# Patient Record
Sex: Male | Born: 1985 | Race: Black or African American | Hispanic: No | Marital: Single | Smoking: Current every day smoker
Health system: Southern US, Community
[De-identification: ages and names within clinical notes are randomized; demographics above are authoritative.]

## PROBLEM LIST (undated history)

## (undated) DIAGNOSIS — N200 Calculus of kidney: Secondary | ICD-10-CM

---
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
## 2006-04-10 ENCOUNTER — Emergency Department (HOSPITAL_COMMUNITY): Admission: EM | Admit: 2006-04-10 | Discharge: 2006-04-10 | Payer: Self-pay | Admitting: Emergency Medicine

## 2007-05-05 ENCOUNTER — Emergency Department (HOSPITAL_COMMUNITY): Admission: EM | Admit: 2007-05-05 | Discharge: 2007-05-05 | Payer: Self-pay | Admitting: Emergency Medicine

## 2007-06-12 ENCOUNTER — Emergency Department (HOSPITAL_COMMUNITY): Admission: EM | Admit: 2007-06-12 | Discharge: 2007-06-12 | Payer: Self-pay | Admitting: Emergency Medicine

## 2011-05-24 LAB — URINALYSIS, ROUTINE W REFLEX MICROSCOPIC
Glucose, UA: NEGATIVE
Protein, ur: NEGATIVE

## 2011-05-24 LAB — DIFFERENTIAL
Eosinophils Absolute: 0.3
Eosinophils Relative: 6 — ABNORMAL HIGH
Lymphs Abs: 2
Monocytes Relative: 7
Neutrophils Relative %: 45

## 2011-05-24 LAB — POCT CARDIAC MARKERS: Myoglobin, poc: 41

## 2011-05-24 LAB — RAPID URINE DRUG SCREEN, HOSP PERFORMED
Barbiturates: NOT DETECTED
Benzodiazepines: NOT DETECTED

## 2011-05-24 LAB — CBC
HCT: 40.6
Hemoglobin: 13.9
MCV: 87.7
RBC: 4.63
WBC: 4.7

## 2011-05-24 LAB — BASIC METABOLIC PANEL
Chloride: 102
GFR calc Af Amer: >60
Potassium: 3.7

## 2016-07-16 ENCOUNTER — Encounter (HOSPITAL_COMMUNITY): Payer: Self-pay | Admitting: Emergency Medicine

## 2016-07-16 ENCOUNTER — Emergency Department (HOSPITAL_COMMUNITY)
Admission: EM | Admit: 2016-07-16 | Discharge: 2016-07-16 | Disposition: A | Discharge status: Home / Self Care | Payer: Self-pay | Attending: Emergency Medicine | Admitting: Emergency Medicine

## 2016-07-16 DIAGNOSIS — H04301 Unspecified dacryocystitis of right lacrimal passage: Secondary | ICD-10-CM | POA: Insufficient documentation

## 2016-07-16 DIAGNOSIS — F17210 Nicotine dependence, cigarettes, uncomplicated: Secondary | ICD-10-CM | POA: Insufficient documentation

## 2016-07-16 MED ORDER — CEPHALEXIN 500 MG PO CAPS
500.0000 mg | ORAL_CAPSULE | Freq: Once | ORAL | Status: AC
  Administered 2016-07-16: 500 mg via ORAL
  Filled 2016-07-16: qty 1

## 2016-07-16 MED ORDER — CEPHALEXIN 500 MG PO CAPS: 500.0000 mg | ORAL_CAPSULE | Freq: Four times a day (QID) | ORAL | 0 refills | Status: DC

## 2016-07-16 NOTE — Discharge Instructions (Signed)
Use OTC lubricating eye drops  every 2-3 hrs.  Warm wet compresses to your eye.  Follow-up with the eye doctor listed if not improving in a few days

## 2016-07-16 NOTE — ED Provider Notes (Signed)
[facility]EMERGENCY DEPT Provider Note  CSN: [id number] Arrival date & time: 07/16/16  1601 By signing my name below, I, Levon HedgerElizabeth Hall, attest that this documentation has been prepared under the direction and in the presence of non-physician practitioner, Pauline Ausammy Raymonda Pell, PA-C. Electronically Signed: Levon HedgerElizabeth Hall, Scribe. 07/16/2016. 5:20 PM.   History   Chief Complaint Chief Complaint  Patient presents with  . Abscess   HPI Willie Ross is a 30 y.o. male who presents to the Emergency Department complaining of swelling and redness under his right eye that began yesterday. He notes associated itching and mild tearing.  Per pt, he first noticed  "a small bump in the corner of my eye" and has continued to swell. He has applied a warm compress with no relief. No other treatments tried PTA. He denies any discharge, facial pain, headache, visual disturbance, fever, nausea, vomiting    The history is provided by the patient. No language interpreter was used.   History reviewed. No pertinent past medical history.  There are no active problems to display for this patient.  History reviewed. No pertinent surgical history.  Home Medications    Prior to Admission medications   Not on File    Family History No family history on file.  Social History Social History  Substance Use Topics  . Smoking status: Current Every Day Smoker    Packs/day: 0.25    Types: Cigarettes  . Smokeless tobacco: Never Used  . Alcohol use Yes     Comment: occ   Allergies   Patient has no known allergies.  Review of Systems Review of Systems  Constitutional: Negative for appetite change, chills and fever.  Eyes: Negative for photophobia, pain, discharge and visual disturbance.       Redness and swelling below the right eye  Respiratory: Negative for cough and shortness of breath.   Gastrointestinal: Negative for nausea and vomiting.  Musculoskeletal: Negative for neck pain and neck stiffness.    Neurological: Negative for dizziness, weakness, numbness and headaches.       Physical Exam Updated Vital Signs BP 118/70   Pulse 80   Temp 98.5 F (36.9 C)   Resp 18   Ht 5\' 10"  (1.778 m)   Wt 135 lb (61.2 kg)   SpO2 100%   BMI 19.37 kg/m   Physical Exam  Constitutional: He is oriented to person, place, and time. He appears well-developed and well-nourished. No distress.  HENT:  Head: Normocephalic and atraumatic.  Mouth/Throat: Oropharynx is clear and moist.  Eyes: Conjunctivae and EOM are normal. Pupils are equal, round, and reactive to light. Lids are everted and swept, no foreign bodies found. Right eye exhibits no chemosis and no discharge. No foreign body present in the right eye. Left eye exhibits no discharge. No scleral icterus.    Mild focal edema and erythema of the right lower lid and along the right medial canthus.    Neck: Normal range of motion.  Cardiovascular: Normal rate, regular rhythm and intact distal pulses.   Pulmonary/Chest: Effort normal.  Lymphadenopathy:    He has no cervical adenopathy.  Neurological: He is alert and oriented to person, place, and time.  Skin: Skin is warm and dry.  Psychiatric: He has a normal mood and affect.  Nursing note and vitals reviewed.  ED Treatments / Results  DIAGNOSTIC STUDIES:  Oxygen Saturation is 100% on RA, normal by my interpretation.    COORDINATION OF CARE:  5:16 PM Discussed treatment plan which includes abx  with pt at bedside and pt agreed to plan.   Labs (all labs ordered are listed, but only abnormal results are displayed) Labs Reviewed - No data to display  EKG  EKG Interpretation None      Radiology No results found.  Procedures Procedures (including critical care time)      Visual Acuity  Right Eye Distance: 20/20 Left Eye Distance: 20/20 Bilateral Distance: 20/20   Medications Ordered in ED Medications - No data to display   Initial Impression / Assessment and Plan /  ED Course  I have reviewed the triage vital signs and the nursing notes.  Pertinent labs & imaging results that were available during my care of the patient were reviewed by me and considered in my medical decision making (see chart for details).  Clinical Course    Pt well appearing.  No involvement of the eye.  Possible early hordeolum vs lacrimal duct infection.  No visual changes. Pt agrees to OTC lubricating eye drop, warm compresses and Rx keflex.  Referral to ophtho.  Return precautions given.    Final Clinical Impressions(s) / ED Diagnoses   Final diagnoses:  Infection of right lacrimal duct    New Prescriptions New Prescriptions   No medications on file    I personally performed the services described in this documentation, which was scribed in my presence. The recorded information has been reviewed and is accurate.    Pauline Ausammy Daegen Berrocal, PA-C 07/17/16 0101    Marily MemosJason Mesner, MD 07/19/16 [phone]

## 2016-07-16 NOTE — ED Triage Notes (Signed)
Patient complains of abscess under right eye that started yesterday. Denies vision changes, fever, n/v.

## 2016-10-23 ENCOUNTER — Emergency Department (HOSPITAL_COMMUNITY)
Admission: EM | Admit: 2016-10-23 | Discharge: 2016-10-23 | Disposition: A | Discharge status: Home / Self Care | Payer: BLUE CROSS/BLUE SHIELD | Attending: Emergency Medicine | Admitting: Emergency Medicine

## 2016-10-23 ENCOUNTER — Encounter (HOSPITAL_COMMUNITY): Payer: Self-pay | Admitting: Cardiology

## 2016-10-23 DIAGNOSIS — F17210 Nicotine dependence, cigarettes, uncomplicated: Secondary | ICD-10-CM | POA: Diagnosis not present

## 2016-10-23 DIAGNOSIS — Z792 Long term (current) use of antibiotics: Secondary | ICD-10-CM | POA: Diagnosis not present

## 2016-10-23 DIAGNOSIS — L0201 Cutaneous abscess of face: Secondary | ICD-10-CM | POA: Diagnosis not present

## 2016-10-23 MED ORDER — LIDOCAINE HCL (PF) 1 % IJ SOLN
5.0000 mL | Freq: Once | INTRAMUSCULAR | Status: AC
  Administered 2016-10-23: 5 mL
  Filled 2016-10-23: qty 5

## 2016-10-23 MED ORDER — POVIDONE-IODINE 10 % EX SOLN
CUTANEOUS | Status: AC
  Filled 2016-10-23: qty 118

## 2016-10-23 MED ORDER — SULFAMETHOXAZOLE-TRIMETHOPRIM 800-160 MG PO TABS: 1.0000 | ORAL_TABLET | Freq: Two times a day (BID) | ORAL | 0 refills | Status: AC

## 2016-10-23 NOTE — ED Provider Notes (Addendum)
[facility] Provider Note   CSN: [id number] Arrival date & time: 10/23/16  1450     History   Chief Complaint Chief Complaint  Patient presents with  . Recurrent Skin Infections    HPI Willie Ross is a 31 y.o. male.  HPI   31 year old male with a facial abscess. Right cheek. Onset shortly after shaving with a razor. Patient states that he usually uses clippers. The lesion has progressively become larger and more painful. No drainage. No fevers or chills. Has not tried taking anything for her symptoms.  History reviewed. No pertinent past medical history.  There are no active problems to display for this patient.   History reviewed. No pertinent surgical history.     Home Medications    Prior to Admission medications   Medication Sig Start Date End Date Taking? Authorizing Provider  cephALEXin (KEFLEX) 500 MG capsule Take 1 capsule (500 mg total) by mouth 4 (four) times daily. For 7 days 07/16/16   Pauline Ausammy Triplett, PA-C    Family History History reviewed. No pertinent family history.  Social History Social History  Substance Use Topics  . Smoking status: Current Every Day Smoker    Packs/day: 0.25    Types: Cigarettes  . Smokeless tobacco: Never Used  . Alcohol use Yes     Comment: occ     Allergies   Patient has no known allergies.   Review of Systems Review of Systems   Physical Exam Updated Vital Signs BP 118/71   Pulse 74   Temp 98.9 F (37.2 C) (Oral)   Resp 16   Ht 6' (1.829 m)   Wt 135 lb (61.2 kg)   SpO2 100%   BMI 18.31 kg/m   Physical Exam  Constitutional: He appears well-developed and well-nourished. No distress.  HENT:  Head: Normocephalic.    Tender, fluctuant lesion in the pictured area. Approximately [id number] cm in size. No surrounding cellulitis. No drainage.  Eyes: Conjunctivae are normal. Right eye exhibits no discharge. Left eye exhibits no discharge.  Neck: Neck supple.  Cardiovascular: Normal  rate, regular rhythm and normal heart sounds.  Exam reveals no gallop and no friction rub.   No murmur heard. Pulmonary/Chest: Effort normal and breath sounds normal. No respiratory distress.  Abdominal: Soft. He exhibits no distension. There is no tenderness.  Musculoskeletal: He exhibits no edema or tenderness.  Neurological: He is alert.  Skin: Skin is warm and dry.  Psychiatric: He has a normal mood and affect. His behavior is normal. Thought content normal.  Nursing note and vitals reviewed.    ED Treatments / Results  Labs (all labs ordered are listed, but only abnormal results are displayed) Labs Reviewed - No data to display  EKG  EKG Interpretation None       Radiology No results found.  Procedures Procedures (including critical care time)  INCISION AND DRAINAGE Performed by: Raeford RazorKOHUT, Oddis Westling Consent: Verbal consent obtained. Risks and benefits: risks, benefits and alternatives were discussed Type: abscess  Body area: R face  Anesthesia: local infiltration  Incision was made with a scalpel.  Local anesthetic: lidocaine 1% w/o  Anesthetic total: 2 ml  Complexity: complex Blunt dissection to break up loculations  Drainage: purulent  Drainage amount: large  Packing material: none  Patient tolerance: Patient tolerated the procedure well with no immediate complications.     Medications Ordered in ED Medications  lidocaine (PF) (XYLOCAINE) 1 % injection 5 mL (not administered)     Initial Impression /  Assessment and Plan / ED Course  I have reviewed the triage vital signs and the nursing notes.  Pertinent labs & imaging results that were available during my care of the patient were reviewed by me and considered in my medical decision making (see chart for details).     31 year old male withfacial abscess the right cheek without surrounding cellulitis. Incised and drained. Continued wound care return precautions discussed.  Final Clinical  Impressions(s) / ED Diagnoses   Final diagnoses:  Facial abscess    New Prescriptions New Prescriptions   No medications on file     Raeford Razor, MD 10/23/16 1610

## 2016-10-23 NOTE — ED Triage Notes (Signed)
Knot to right side of face for 3 days.

## 2016-10-23 NOTE — ED Notes (Signed)
edp in with pt 

## 2019-06-11 ENCOUNTER — Other Ambulatory Visit: Payer: Self-pay

## 2019-06-11 DIAGNOSIS — Z20822 Contact with and (suspected) exposure to covid-19: Secondary | ICD-10-CM

## 2019-06-12 LAB — NOVEL CORONAVIRUS, NAA: SARS-CoV-2, NAA: DETECTED — AB

## 2019-06-20 ENCOUNTER — Other Ambulatory Visit: Payer: Self-pay

## 2019-06-20 DIAGNOSIS — Z20822 Contact with and (suspected) exposure to covid-19: Secondary | ICD-10-CM

## 2019-06-21 ENCOUNTER — Telehealth: Payer: Self-pay

## 2019-06-21 NOTE — Telephone Encounter (Signed)
Patient called in requesting [facility] lab results - DOB/Verified - results pending. Reviewed testing process with patient. Assisted with MyChart setup, no further questions.

## 2019-06-22 ENCOUNTER — Telehealth: Payer: Self-pay

## 2019-06-22 LAB — NOVEL CORONAVIRUS, NAA

## 2019-06-22 NOTE — Telephone Encounter (Signed)
Received call from patient checking Covid results.  Advised results are positive.  Advised to contact PCP or visit ED if symptoms worsen.   

## 2019-06-22 NOTE — Telephone Encounter (Signed)
Pt called for lab results advised that final report has not come back for covid.

## 2020-08-02 ENCOUNTER — Other Ambulatory Visit: Payer: Self-pay

## 2020-08-02 ENCOUNTER — Emergency Department (HOSPITAL_COMMUNITY)
Admission: EM | Admit: 2020-08-02 | Discharge: 2020-08-02 | Disposition: A | Discharge status: Home / Self Care | Payer: BC Managed Care – PPO | Attending: Emergency Medicine | Admitting: Emergency Medicine

## 2020-08-02 ENCOUNTER — Emergency Department (HOSPITAL_COMMUNITY): Payer: BC Managed Care – PPO

## 2020-08-02 ENCOUNTER — Encounter (HOSPITAL_COMMUNITY): Payer: Self-pay

## 2020-08-02 DIAGNOSIS — Z87891 Personal history of nicotine dependence: Secondary | ICD-10-CM | POA: Diagnosis not present

## 2020-08-02 DIAGNOSIS — N200 Calculus of kidney: Secondary | ICD-10-CM | POA: Diagnosis not present

## 2020-08-02 DIAGNOSIS — R109 Unspecified abdominal pain: Secondary | ICD-10-CM | POA: Diagnosis not present

## 2020-08-02 HISTORY — DX: Calculus of kidney: N20.0

## 2020-08-02 LAB — URINALYSIS, ROUTINE W REFLEX MICROSCOPIC
Bilirubin Urine: NEGATIVE
Glucose, UA: NEGATIVE mg/dL
Hgb urine dipstick: NEGATIVE
Ketones, ur: NEGATIVE mg/dL
Leukocytes,Ua: NEGATIVE
Nitrite: NEGATIVE
Protein, ur: NEGATIVE mg/dL
Specific Gravity, Urine: 1.016 (ref 1.005–1.030)
pH: 5 (ref 5.0–8.0)

## 2020-08-02 MED ORDER — KETOROLAC TROMETHAMINE 30 MG/ML IJ SOLN
30.0000 mg | Freq: Once | INTRAMUSCULAR | Status: AC
  Administered 2020-08-02: 30 mg via INTRAMUSCULAR
  Filled 2020-08-02: qty 1

## 2020-08-02 MED ORDER — IBUPROFEN 800 MG PO TABS: 800.0000 mg | ORAL_TABLET | Freq: Three times a day (TID) | ORAL | 0 refills | Status: DC | PRN

## 2020-08-02 MED ORDER — DICLOFENAC SODIUM 1 % EX GEL: 2.0000 g | Freq: Four times a day (QID) | CUTANEOUS | 0 refills | Status: DC | PRN

## 2020-08-02 NOTE — ED Notes (Signed)
Pt ambulatory to waiting room. Pt verbalized understanding of discharge instructions.   

## 2020-08-02 NOTE — ED Provider Notes (Signed)
Emergency Department Provider Note   I have reviewed the triage vital signs and the nursing notes.   HISTORY  Chief Complaint Flank Pain   HPI Willie Ross is a 34 y.o. male with past medical history of kidney stone presents to the emergency department with left flank pain which began after arriving at work today.  Patient states that he has a known kidney stone on the left but not had significant symptoms from this.  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  He denies any lower back pain or radiation of pain into the legs.  Denies numbness.  No bowel/bladder incontinence.  No dysuria, hesitancy, urgency.  No fevers.  Past Medical History:  Diagnosis Date   Kidney stones     There are no problems to display for this patient.   History reviewed. No pertinent surgical history.  Allergies Patient has no known allergies.  No family history on file.  Social History Social History   Tobacco Use   Smoking status: Former Smoker    Packs/day: 0.25    Types: Cigarettes   Smokeless tobacco: Never Used  Substance Use Topics   Alcohol use: Not Currently    Comment: occ   Drug use: No    Review of Systems  Constitutional: No fever/chills Eyes: No visual changes. ENT: No sore throat. Cardiovascular: Denies chest pain. Respiratory: Denies shortness of breath. Gastrointestinal: Positive flank/abdominal pain (left).  No nausea, no vomiting.  No diarrhea.  No constipation. Genitourinary: Negative for dysuria. Musculoskeletal: Positive for left back pain. Skin: Negative for rash. Neurological: Negative for headaches, focal weakness or numbness.  10-point ROS otherwise negative.  ____________________________________________   PHYSICAL EXAM:  VITAL SIGNS: ED Triage Vitals  Enc Vitals Group     BP 08/02/20 0805 (!) 127/96     Pulse Rate 08/02/20 0805 96      Resp 08/02/20 0805 17     Temp 08/02/20 0805 99.6 F (37.6 C)     Temp Source 08/02/20 0805 Oral     SpO2 08/02/20 0805 100 %     Weight 08/02/20 0804 135 lb (61.2 kg)     Height 08/02/20 0804 6' (1.829 m)   Constitutional: Alert and oriented. Well appearing and in no acute distress. Eyes: Conjunctivae are normal Head: Atraumatic. Nose: No congestion/rhinnorhea. Mouth/Throat: Mucous membranes are moist.  Neck: No stridor.  Cardiovascular: Normal rate, regular rhythm. Good peripheral circulation. Grossly normal heart sounds.   Respiratory: Normal respiratory effort.  No retractions. Lungs CTAB. Gastrointestinal: Soft and nontender. No distention.  Musculoskeletal: No lower extremity tenderness nor edema. No gross deformities of extremities. Neurologic:  Normal speech and language. No gross focal neurologic deficits are appreciated.  Skin:  Skin is warm, dry and intact. No rash noted.  ____________________________________________   LABS (all labs ordered are listed, but only abnormal results are displayed)  Labs Reviewed  URINALYSIS, ROUTINE W REFLEX MICROSCOPIC   ____________________________________________  RADIOLOGY  CT Renal Stone Study  Result Date: 08/02/2020 CLINICAL DATA:  Left flank pain EXAM: CT ABDOMEN AND PELVIS WITHOUT CONTRAST TECHNIQUE: Multidetector CT imaging of the abdomen and pelvis was performed following the standard protocol without oral or IV contrast. COMPARISON:  None. FINDINGS: Lower chest: Lung bases are clear. Hepatobiliary: No focal liver lesions are evident on this noncontrast enhanced study. Gallbladder wall is not appreciably thickened. There is no biliary  duct dilatation. Pancreas: No pancreatic mass or inflammatory focus. Spleen: No splenic lesions are evident. Adrenals/Urinary Tract: Adrenals bilaterally appear normal. There is no appreciable renal mass or hydronephrosis on either side. There is no appreciable renal or ureteral calculus on  either side. Urinary bladder is midline with wall thickness within normal limits. Stomach/Bowel: There is no appreciable bowel wall or mesenteric thickening. There is no evident bowel obstruction. The terminal ileum appears normal. There is no evident free air or portal venous air. Vascular/Lymphatic: There is no evident abdominal aortic aneurysm. No arterial vascular lesions are evident. There is a circumaortic left renal vein, an anatomic variant. No adenopathy is evident in the abdomen or pelvis. Reproductive: Prostate and seminal vesicles are normal in size and configuration. Other: Appendix appears normal. No abscess or ascites is evident in the abdomen or pelvis. Musculoskeletal: No blastic or lytic bone lesions. No intramuscular lesions. IMPRESSION: 1. A cause for patient's symptoms has not established with this study. 2. No evident renal ureteral calculus on either side. There is no appreciable hydronephrosis. Urinary bladder wall thickness is normal. 3. No bowel wall thickening or bowel obstruction. No abscess in the abdomen or pelvis. Appendix region appears normal. Electronically Signed   By: Bretta Bang III M.D.   On: 08/02/2020 09:46    ____________________________________________   PROCEDURES  Procedure(s) performed:   Procedures  None  ____________________________________________   INITIAL IMPRESSION / ASSESSMENT AND PLAN / ED COURSE  Pertinent labs & imaging results that were available during my care of the patient were reviewed by me and considered in my medical decision making (see chart for details).   Patient presents to the emergency department for evaluation of left flank pain worse with moving heavy bags at work.  No neurologic deficits in the lower extremities including strength, sensation, reflexes.  No anterior abdominal tenderness.  Patient has known kidney stone by his report and so plan for CT renal to evaluate for this further.  We will also obtain UA but doubt  infection or pyelonephritis.  No findings on exam to suspect acute spine emergency such as cauda equina, hemorrhage, or spine infection.  No indication for MRI.   11:15 AM  CT imaging reviewed showing no stone or other acute process.  UA without infection or hemoglobin.  Suspect MSK etiology clinically.  Will discharge home with Motrin and Voltaren along with work note.  Discussed plan for close PCP follow-up along with ED return precautions. ____________________________________________  FINAL CLINICAL IMPRESSION(S) / ED DIAGNOSES  Final diagnoses:  Left flank pain     MEDICATIONS GIVEN DURING THIS VISIT:  Medications  ketorolac (TORADOL) 30 MG/ML injection 30 mg (30 mg Intramuscular Given 08/02/20 0849)     NEW OUTPATIENT MEDICATIONS STARTED DURING THIS VISIT:  New Prescriptions   DICLOFENAC SODIUM (VOLTAREN) 1 % GEL    Apply 2 g topically 4 (four) times daily as needed.   IBUPROFEN (ADVIL) 800 MG TABLET    Take 1 tablet (800 mg total) by mouth every 8 (eight) hours as needed for moderate pain.    Note:  This document was prepared using Dragon voice recognition software and may include unintentional dictation errors.  Alona Bene, MD, [facility] Emergency Medicine    Olson Lucarelli, Arlyss Repress, MD 08/02/20 1115

## 2020-08-02 NOTE — ED Triage Notes (Signed)
Pt reports that he has 8 mm stone  In left kidney. Pt reports that pain started this morning after lifting heavy bag

## 2020-08-02 NOTE — Discharge Instructions (Signed)

## 2020-08-09 ENCOUNTER — Emergency Department (HOSPITAL_COMMUNITY)
Admission: EM | Admit: 2020-08-09 | Discharge: 2020-08-10 | Disposition: A | Discharge status: Home / Self Care | Payer: BC Managed Care – PPO | Attending: Emergency Medicine | Admitting: Emergency Medicine

## 2020-08-09 ENCOUNTER — Encounter (HOSPITAL_COMMUNITY): Payer: Self-pay | Admitting: Emergency Medicine

## 2020-08-09 ENCOUNTER — Other Ambulatory Visit: Payer: Self-pay

## 2020-08-09 DIAGNOSIS — R112 Nausea with vomiting, unspecified: Secondary | ICD-10-CM | POA: Diagnosis not present

## 2020-08-09 DIAGNOSIS — Z87891 Personal history of nicotine dependence: Secondary | ICD-10-CM | POA: Insufficient documentation

## 2020-08-09 LAB — COMPREHENSIVE METABOLIC PANEL
ALT: 64 U/L — ABNORMAL HIGH (ref 0–44)
AST: 47 U/L — ABNORMAL HIGH (ref 15–41)
Albumin: 5.1 g/dL — ABNORMAL HIGH (ref 3.5–5.0)
Alkaline Phosphatase: 44 U/L (ref 38–126)
Anion gap: 11 (ref 5–15)
BUN: 12 mg/dL (ref 6–20)
CO2: 28 mmol/L (ref 22–32)
Calcium: 9.9 mg/dL (ref 8.9–10.3)
Chloride: 93 mmol/L — ABNORMAL LOW (ref 98–111)
Creatinine, Ser: 0.88 mg/dL (ref 0.61–1.24)
GFR, Estimated: >60 mL/min (ref >60)
Glucose, Bld: 93 mg/dL (ref 70–99)
Potassium: 4.2 mmol/L (ref 3.5–5.1)
Sodium: 132 mmol/L — ABNORMAL LOW (ref 135–145)
Total Bilirubin: 1.4 mg/dL — ABNORMAL HIGH (ref 0.3–1.2)
Total Protein: 8.4 g/dL — ABNORMAL HIGH (ref 6.5–8.1)

## 2020-08-09 LAB — CBC
HCT: 44.9 % (ref 39.0–52.0)
Hemoglobin: 15.3 g/dL (ref 13.0–17.0)
MCH: 30.8 pg (ref 26.0–34.0)
MCHC: 34.1 g/dL (ref 30.0–36.0)
MCV: 90.3 fL (ref 80.0–100.0)
Platelets: 276 10*3/uL (ref 150–400)
RBC: 4.97 MIL/uL (ref 4.22–5.81)
RDW: 12.1 % (ref 11.5–15.5)
WBC: 6.5 10*3/uL (ref 4.0–10.5)
nRBC: 0 % (ref 0.0–0.2)

## 2020-08-09 LAB — LIPASE, BLOOD: Lipase: 27 U/L (ref 11–51)

## 2020-08-09 MED ORDER — PROMETHAZINE HCL 25 MG/ML IJ SOLN
25.0000 mg | Freq: Once | INTRAMUSCULAR | Status: AC
  Administered 2020-08-10: 25 mg via INTRAMUSCULAR
  Filled 2020-08-09: qty 1

## 2020-08-09 NOTE — ED Provider Notes (Signed)
[facility] EMERGENCY DEPARTMENT Provider Note   CSN: [id number] Arrival date & time: 08/09/20  1846     History Chief Complaint  Patient presents with  . Emesis    Willie Ross is a 34 y.o. male.  Woke up this morning. Felt unwell. Had multiple episodes of nbnb emesis. 4-5 episodes of loose stools. No fevers. No urinary symptoms. No sick contacts. No suspicios food intake. No abdominal pain aside that associated with emesis. Hasn't tried anything to help with symptoms.         Past Medical History:  Diagnosis Date  . Kidney stones     There are no problems to display for this patient.   History reviewed. No pertinent surgical history.     History reviewed. No pertinent family history.  Social History   Tobacco Use  . Smoking status: Former Smoker    Packs/day: 0.25    Types: Cigarettes    Quit date: 08/10/2019    Years since quitting: 1.0  . Smokeless tobacco: Never Used  Vaping Use  . Vaping Use: Every day  . Substances: Nicotine, Flavoring  Substance Use Topics  . Alcohol use: Not Currently    Alcohol/week: 18.0 standard drinks    Types: 18 Cans of beer per week    Comment: occ  . Drug use: No    Home Medications Prior to Admission medications   Medication Sig Start Date End Date Taking? Authorizing Provider  diclofenac Sodium (VOLTAREN) 1 % GEL Apply 2 g topically 4 (four) times daily as needed. 08/02/20  Yes Long, Arlyss Repress, MD  ibuprofen (ADVIL) 800 MG tablet Take 1 tablet (800 mg total) by mouth every 8 (eight) hours as needed for moderate pain. 08/02/20  Yes Long, Arlyss Repress, MD  ondansetron (ZOFRAN ODT) 4 MG disintegrating tablet 4mg  ODT q4 hours prn nausea/vomit 08/10/20  Yes Shaana Acocella, 08/12/20, MD  promethazine (PHENERGAN) 25 MG suppository Place 1 suppository (25 mg total) rectally every 6 (six) hours as needed for nausea or vomiting. 08/10/20  Yes Eilidh Marcano, 08/12/20, MD  promethazine (PHENERGAN) 25 MG tablet Take 1 tablet (25 mg total) by  mouth every 6 (six) hours as needed for nausea or vomiting. 08/10/20  Yes Lashawna Poche, 08/12/20, MD    Allergies    Patient has no known allergies.  Review of Systems   Review of Systems  All other systems reviewed and are negative.   Physical Exam Updated Vital Signs BP 122/62   Pulse 75   Temp 99 F (37.2 C) (Oral)   Resp 14   Ht 6' (1.829 m)   Wt 61.2 kg   SpO2 98%   BMI 18.31 kg/m   Physical Exam Vitals and nursing note reviewed.  Constitutional:      Appearance: He is well-developed and well-nourished.     Comments: Appears to feel unwell but no distress  HENT:     Head: Normocephalic and atraumatic.     Mouth/Throat:     Mouth: Mucous membranes are moist.     Pharynx: Oropharynx is clear.  Eyes:     Pupils: Pupils are equal, round, and reactive to light.  Cardiovascular:     Rate and Rhythm: Normal rate.  Pulmonary:     Effort: Pulmonary effort is normal. No respiratory distress.  Abdominal:     General: There is no distension.     Tenderness: There is no abdominal tenderness. There is no guarding or rebound.     Hernia: No hernia is present.  Musculoskeletal:        General: Normal range of motion.     Cervical back: Normal range of motion.  Skin:    General: Skin is warm and dry.  Neurological:     General: No focal deficit present.     Mental Status: He is alert.     ED Results / Procedures / Treatments   Labs (all labs ordered are listed, but only abnormal results are displayed) Labs Reviewed  COMPREHENSIVE METABOLIC PANEL - Abnormal; Notable for the following components:      Result Value   Sodium 132 (*)    Chloride 93 (*)    Total Protein 8.4 (*)    Albumin 5.1 (*)    AST 47 (*)    ALT 64 (*)    Total Bilirubin 1.4 (*)    All other components within normal limits  LIPASE, BLOOD  CBC  URINALYSIS, ROUTINE W REFLEX MICROSCOPIC    EKG None  Radiology No results found.  Procedures Procedures (including critical care time)  Medications  Ordered in ED Medications  promethazine (PHENERGAN) injection 25 mg (25 mg Intramuscular Given 08/10/20 0034)    ED Course  I have reviewed the triage vital signs and the nursing notes.  Pertinent labs & imaging results that were available during my care of the patient were reviewed by me and considered in my medical decision making (see chart for details).    MDM Rules/Calculators/A&P                          Gastroenteritis, likely viral.  Patient tolerating p.o. here.  Abdomen benign low suspicion for appendicitis bowel obstruction or other intra-abdominal pathology requiring CT scan.  No indication for admission to hospital.  Patient appears to be stable for discharge at this time.   Final Clinical Impression(s) / ED Diagnoses Final diagnoses:  Non-intractable vomiting with nausea, unspecified vomiting type    Rx / DC Orders ED Discharge Orders         Ordered    ondansetron (ZOFRAN ODT) 4 MG disintegrating tablet        08/10/20 0234    promethazine (PHENERGAN) 25 MG tablet  Every 6 hours PRN        08/10/20 0235    promethazine (PHENERGAN) 25 MG suppository  Every 6 hours PRN        08/10/20 0235           Letanya Froh, Barbara Cower, MD 08/10/20 0451

## 2020-08-09 NOTE — ED Triage Notes (Signed)
Pt c/o vomiting that began today. Pt states he was tested for covid in [place] today, but has not gotten his results yet.

## 2020-08-10 MED ORDER — ONDANSETRON 4 MG PO TBDP: ORAL_TABLET | ORAL | 0 refills | Status: DC

## 2020-08-10 MED ORDER — PROMETHAZINE HCL 25 MG PO TABS: 25.0000 mg | ORAL_TABLET | Freq: Four times a day (QID) | ORAL | 0 refills | Status: DC | PRN

## 2020-08-10 MED ORDER — PROMETHAZINE HCL 25 MG RE SUPP: 25.0000 mg | Freq: Four times a day (QID) | RECTAL | 0 refills | Status: DC | PRN

## 2020-12-08 ENCOUNTER — Encounter (HOSPITAL_COMMUNITY): Payer: Self-pay

## 2020-12-08 ENCOUNTER — Emergency Department (HOSPITAL_COMMUNITY): Payer: BC Managed Care – PPO

## 2020-12-08 ENCOUNTER — Other Ambulatory Visit: Payer: Self-pay

## 2020-12-08 ENCOUNTER — Emergency Department (HOSPITAL_COMMUNITY)
Admission: EM | Admit: 2020-12-08 | Discharge: 2020-12-08 | Disposition: A | Discharge status: Home / Self Care | Payer: BC Managed Care – PPO | Attending: Emergency Medicine | Admitting: Emergency Medicine

## 2020-12-08 DIAGNOSIS — Z87891 Personal history of nicotine dependence: Secondary | ICD-10-CM | POA: Diagnosis not present

## 2020-12-08 DIAGNOSIS — R079 Chest pain, unspecified: Secondary | ICD-10-CM | POA: Insufficient documentation

## 2020-12-08 LAB — COMPREHENSIVE METABOLIC PANEL
ALT: 36 U/L (ref 0–44)
AST: 32 U/L (ref 15–41)
Albumin: 4.6 g/dL (ref 3.5–5.0)
Alkaline Phosphatase: 54 U/L (ref 38–126)
Anion gap: 12 (ref 5–15)
BUN: 11 mg/dL (ref 6–20)
CO2: 27 mmol/L (ref 22–32)
Calcium: 9.4 mg/dL (ref 8.9–10.3)
Chloride: 96 mmol/L — ABNORMAL LOW (ref 98–111)
Creatinine, Ser: 0.8 mg/dL (ref 0.61–1.24)
GFR, Estimated: >60 mL/min (ref >60)
Glucose, Bld: 93 mg/dL (ref 70–99)
Potassium: 4.7 mmol/L (ref 3.5–5.1)
Sodium: 135 mmol/L (ref 135–145)
Total Bilirubin: 0.9 mg/dL (ref 0.3–1.2)
Total Protein: 8.2 g/dL — ABNORMAL HIGH (ref 6.5–8.1)

## 2020-12-08 LAB — TROPONIN I (HIGH SENSITIVITY)
Troponin I (High Sensitivity): <2 ng/L (ref <18)
Troponin I (High Sensitivity): <2 ng/L (ref <18)

## 2020-12-08 LAB — CBC
HCT: 43.8 % (ref 39.0–52.0)
Hemoglobin: 14.4 g/dL (ref 13.0–17.0)
MCH: 31.3 pg (ref 26.0–34.0)
MCHC: 32.9 g/dL (ref 30.0–36.0)
MCV: 95.2 fL (ref 80.0–100.0)
Platelets: 244 10*3/uL (ref 150–400)
RBC: 4.6 MIL/uL (ref 4.22–5.81)
RDW: 13.3 % (ref 11.5–15.5)
WBC: 6.4 10*3/uL (ref 4.0–10.5)
nRBC: 0 % (ref 0.0–0.2)

## 2020-12-08 LAB — LIPASE, BLOOD: Lipase: 32 U/L (ref 11–51)

## 2020-12-08 MED ORDER — ALUM & MAG HYDROXIDE-SIMETH 200-200-20 MG/5ML PO SUSP
30.0000 mL | Freq: Once | ORAL | Status: AC
  Administered 2020-12-08: 30 mL via ORAL
  Filled 2020-12-08: qty 30

## 2020-12-08 MED ORDER — LIDOCAINE VISCOUS HCL 2 % MT SOLN
15.0000 mL | Freq: Once | OROMUCOSAL | Status: AC
  Administered 2020-12-08: 15 mL via ORAL
  Filled 2020-12-08: qty 15

## 2020-12-08 NOTE — ED Provider Notes (Signed)
[facility] EMERGENCY DEPARTMENT Provider Note   CSN: [id number] Arrival date & time: 12/08/20  7829     History Chief Complaint  Patient presents with  . Chest Pain    Willie Ross is a 35 y.o. male.  HPI 35 year old male who complains of intermittent chest pain that has been ongoing for the last 2 weeks.  Patient reports chest pain will be burning and aching in his controlled chest.  Currently experiencing it now.  He cannot identify a pattern, does not think it is worse with eating or ambulation.  No aggravating or alleviating factors.  He does endorse drinking several beers every evening after work.  He reports that he fell earlier this month and is doing some physical therapy for his back.  He denies any dizziness, syncope, shortness of breath.  No radiation.  Denies any history of cardiac issues.  Denies any illicit drug use.  He has never had anything like this happen in the past.    Past Medical History:  Diagnosis Date  . Kidney stones     There are no problems to display for this patient.   History reviewed. No pertinent surgical history.     History reviewed. No pertinent family history.  Social History   Tobacco Use  . Smoking status: Former Smoker    Packs/day: 0.25    Types: Cigarettes    Quit date: 08/10/2019    Years since quitting: 1.3  . Smokeless tobacco: Never Used  Vaping Use  . Vaping Use: Every day  . Substances: Nicotine, Flavoring  Substance Use Topics  . Alcohol use: Not Currently    Alcohol/week: 18.0 standard drinks    Types: 18 Cans of beer per week    Comment: occ  . Drug use: No    Home Medications Prior to Admission medications   Medication Sig Start Date End Date Taking? Authorizing Provider  diclofenac Sodium (VOLTAREN) 1 % GEL Apply 2 g topically 4 (four) times daily as needed. Patient not taking: Reported on 12/08/2020 08/02/20   Maia Plan, MD  ibuprofen (ADVIL) 800 MG tablet Take 1 tablet (800 mg total) by  mouth every 8 (eight) hours as needed for moderate pain. Patient not taking: Reported on 12/08/2020 08/02/20   Maia Plan, MD  ondansetron (ZOFRAN ODT) 4 MG disintegrating tablet 4mg  ODT q4 hours prn nausea/vomit Patient not taking: No sig reported 08/10/20   Mesner, 08/12/20, MD  promethazine (PHENERGAN) 25 MG suppository Place 1 suppository (25 mg total) rectally every 6 (six) hours as needed for nausea or vomiting. Patient not taking: No sig reported 08/10/20   Mesner, 08/12/20, MD  promethazine (PHENERGAN) 25 MG tablet Take 1 tablet (25 mg total) by mouth every 6 (six) hours as needed for nausea or vomiting. Patient not taking: No sig reported 08/10/20   Mesner, 08/12/20, MD    Allergies    Patient has no known allergies.  Review of Systems   Review of Systems  Constitutional: Negative for chills and fever.  HENT: Negative for ear pain and sore throat.   Eyes: Negative for pain and visual disturbance.  Respiratory: Negative for cough and shortness of breath.   Cardiovascular: Positive for chest pain. Negative for palpitations.  Gastrointestinal: Negative for abdominal pain and vomiting.  Genitourinary: Negative for dysuria and hematuria.  Musculoskeletal: Negative for arthralgias and back pain.  Skin: Negative for color change and rash.  Neurological: Negative for seizures and syncope.  All other systems reviewed and are  negative.   Physical Exam Updated Vital Signs BP 134/90   Pulse 75   Temp 98.1 F (36.7 C) (Oral)   Resp 19   Ht 6\' 1"  (1.854 m)   Wt 61.2 kg   SpO2 100%   BMI 17.81 kg/m   Physical Exam Vitals and nursing note reviewed.  Constitutional:      Appearance: He is well-developed.  HENT:     Head: Normocephalic and atraumatic.  Eyes:     Conjunctiva/sclera: Conjunctivae normal.  Cardiovascular:     Rate and Rhythm: Normal rate and regular rhythm.     Heart sounds: Normal heart sounds. No murmur heard.   Pulmonary:     Effort: Pulmonary effort is  normal. No respiratory distress.     Breath sounds: Normal breath sounds.  Chest:     Chest wall: No tenderness.  Abdominal:     Palpations: Abdomen is soft.     Tenderness: There is no abdominal tenderness.  Musculoskeletal:        General: Normal range of motion.     Cervical back: Neck supple.     Right lower leg: No edema.     Left lower leg: No edema.  Skin:    General: Skin is warm and dry.  Neurological:     General: No focal deficit present.     Mental Status: He is alert.  Psychiatric:        Mood and Affect: Mood normal.        Behavior: Behavior normal.     ED Results / Procedures / Treatments   Labs (all labs ordered are listed, but only abnormal results are displayed) Labs Reviewed  COMPREHENSIVE METABOLIC PANEL - Abnormal; Notable for the following components:      Result Value   Chloride 96 (*)    Total Protein 8.2 (*)    All other components within normal limits  CBC  LIPASE, BLOOD  TROPONIN I (HIGH SENSITIVITY)  TROPONIN I (HIGH SENSITIVITY)    EKG EKG Interpretation  Date/Time:  Wednesday December 08 2020 09:44:42 EDT Ventricular Rate:  80 PR Interval:  187 QRS Duration: 74 QT Interval:  354 QTC Calculation: 409 R Axis:   80 Text Interpretation: Sinus rhythm Borderline T abnormalities, lateral leads ST elev, probable normal early repol pattern Confirmed by [birth year]1985 [phone]) on 12/08/2020 9:46:29 AM   Radiology DG Chest Portable 1 View  Result Date: 12/08/2020 CLINICAL DATA:  Chest pain 2 weeks EXAM: PORTABLE CHEST 1 VIEW COMPARISON:  None. FINDINGS: The heart size and mediastinal contours are within normal limits. Both lungs are clear. The visualized skeletal structures are unremarkable. IMPRESSION: No active disease. Electronically Signed   By: 12/10/2020 M.D.   On: 12/08/2020 10:04    Procedures Procedures   Medications Ordered in ED Medications  alum & mag hydroxide-simeth (MAALOX/MYLANTA) 200-200-20 MG/5ML suspension 30 mL  (30 mLs Oral Given 12/08/20 0951)    And  lidocaine (XYLOCAINE) 2 % viscous mouth solution 15 mL (15 mLs Oral Given 12/08/20 12/10/20)    ED Course  I have reviewed the triage vital signs and the nursing notes.  Pertinent labs & imaging results that were available during my care of the patient were reviewed by me and considered in my medical decision making (see chart for details).    MDM Rules/Calculators/A&P                          35 year old male  with complaints of chest pain which has been intermittent over the last 2 weeks.  On arrival, he is well-appearing, no acute distress, resting comfortably in the ER bed.  Vitals overall reassuring.  No evidence of tachycardia, tachypnea or hypoxia.  He has no shortness of breath.  Physical exam is unremarkable, no lower extremity edema noted Overall work-up reassuring.  Labs unremarkable, EKG without ischemic changes.  Chest x-ray without acute abnormalities. Patient is to be discharged with recommendation to follow up with PCP in regards to today's hospital visit. Chest pain is not likely of cardiac or pulmonary etiology d/t presentation, PERC negative, VSS, no tracheal deviation, no JVD or new murmur, RRR, breath sounds equal bilaterally, EKG without acute abnormalities, negative delta troponin, and negative CXR.  He was given GI cocktail with improvement, suspect GERD.  Pt has been advised to return to the ED if CP becomes exertional, associated with diaphoresis or nausea, radiates to left jaw/arm, worsens or becomes concerning in any way. Pt appears reliable for follow up and is agreeable to discharge.      Final Clinical Impression(s) / ED Diagnoses Final diagnoses:  Chest pain, unspecified type    Rx / DC Orders ED Discharge Orders    None       Leone Brand 12/08/20 1408    Benjiman Core, MD 12/08/20 1536

## 2020-12-08 NOTE — ED Triage Notes (Signed)
Presents for chest pain that started 2 weeks ago Describes as burning and aching  Denies previous heart issues Reports he fell earlier this month and has been receiving outpatient PT for this - wearing a back brace

## 2020-12-08 NOTE — Discharge Instructions (Signed)
Your work-up today was overall reassuring.  Your symptoms could be due to gastric reflux, you should avoid high fatty foods and you may also take over-the-counter medicine such as omeprazole or Tums for your symptoms.  Please follow-up with your primary care doctor if your symptoms continue.  Return to the ER for any new or worsening symptoms.

## 2021-10-22 IMAGING — CT CT RENAL STONE PROTOCOL
2 of 4 series · 15 of 46 positions shown, 17 images · non-contrast
Comparison: None.

CLINICAL DATA: Left flank pain

EXAM:
CT ABDOMEN AND PELVIS WITHOUT CONTRAST
TECHNIQUE: Multidetector CT imaging of the abdomen and pelvis was performed
following the standard protocol without oral or IV contrast.

[Series 2: axial st · axial · 0.62mm/px · z∈[+904,+1290]mm · 12 of 87 slices shown, 14 images]
[im 5/87  soft-tissue]
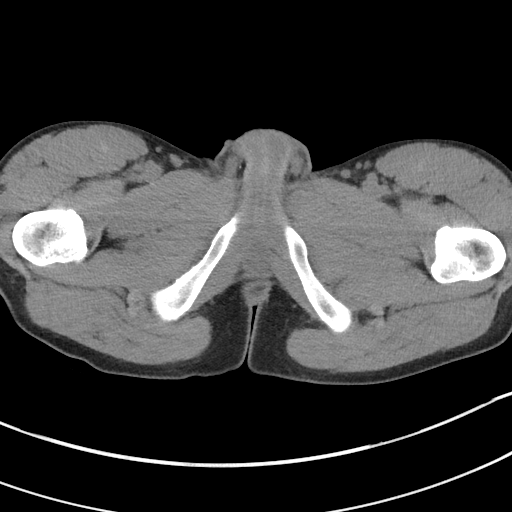
[im 5/87  bone]
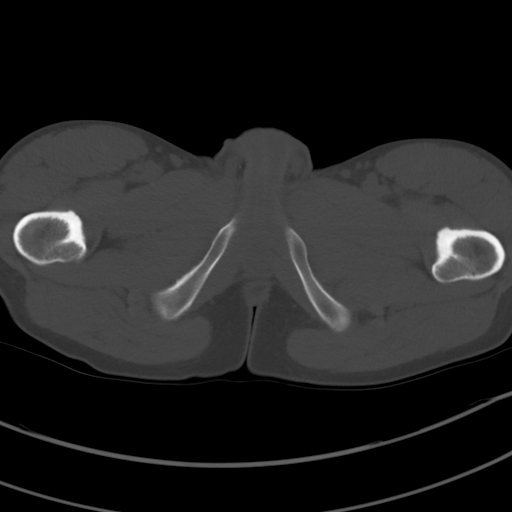
[im 15/87  soft-tissue]
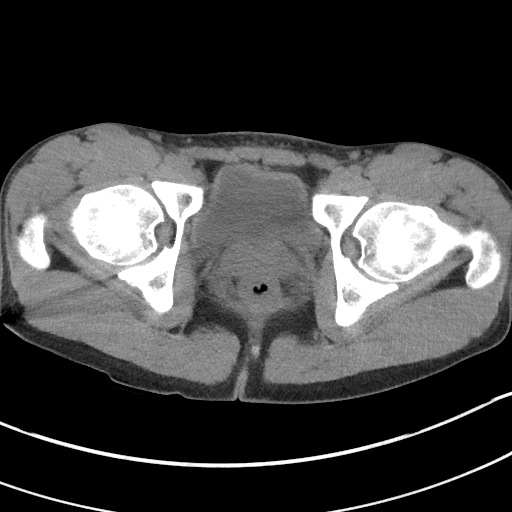
[im 20/87  soft-tissue]
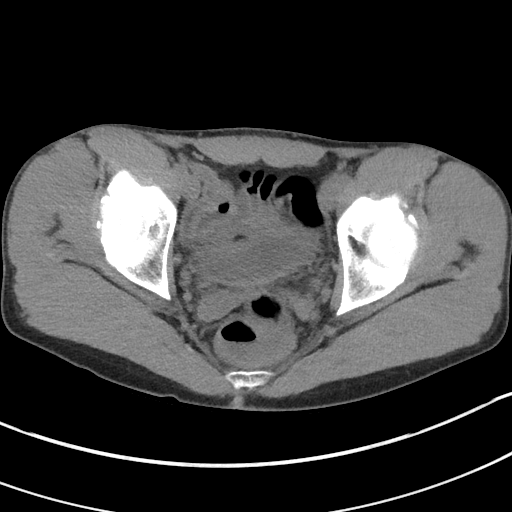
[im 24/87  soft-tissue]
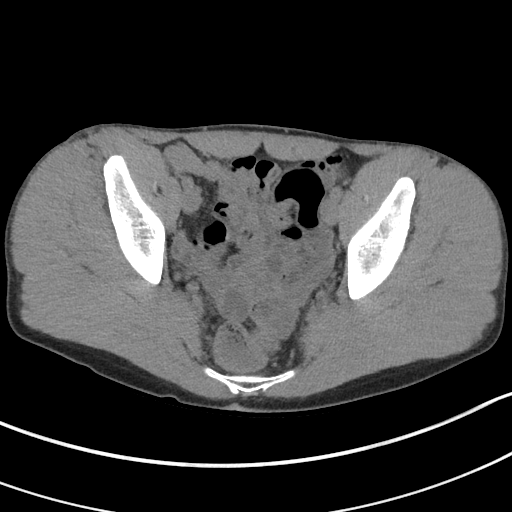
[im 34/87  soft-tissue]
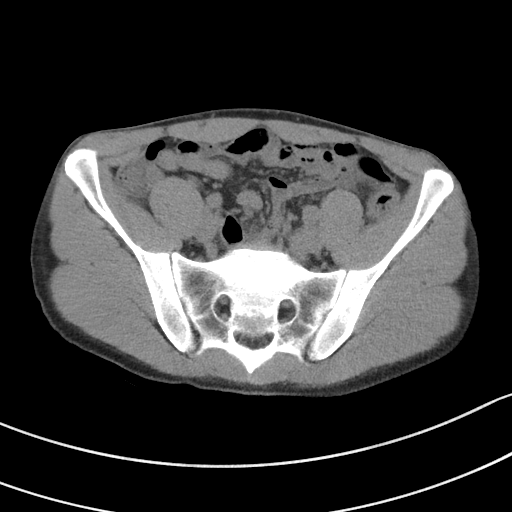
[im 39/87  soft-tissue]
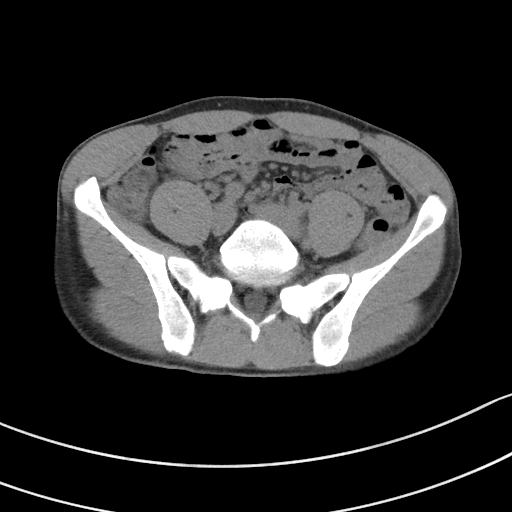
[im 48/87  soft-tissue]
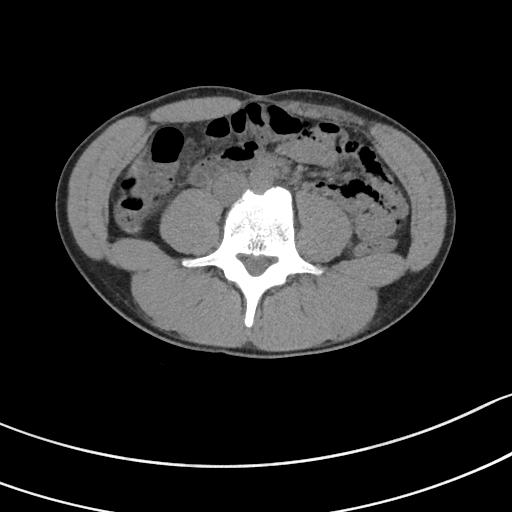
[im 53/87  soft-tissue]
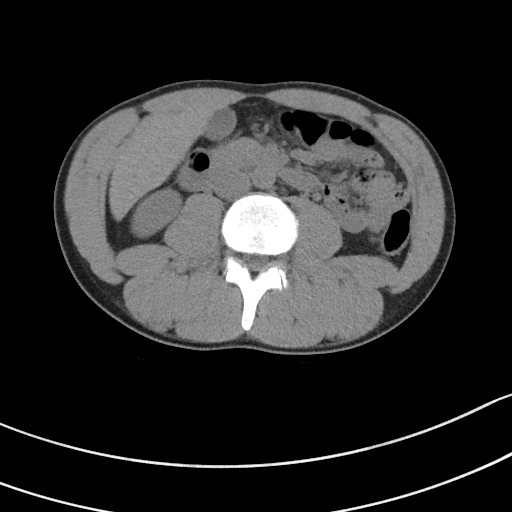
[im 63/87  soft-tissue]
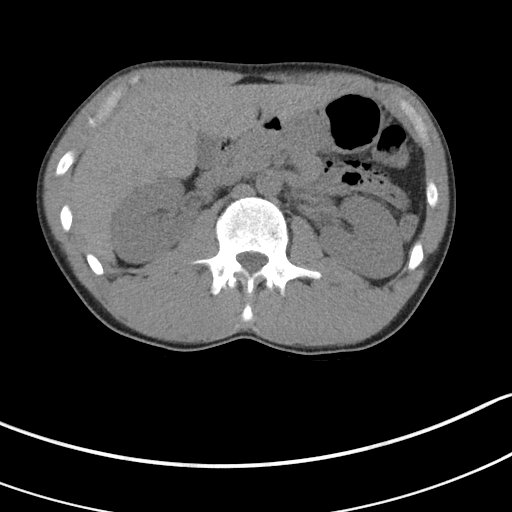
[im 63/87  bone]
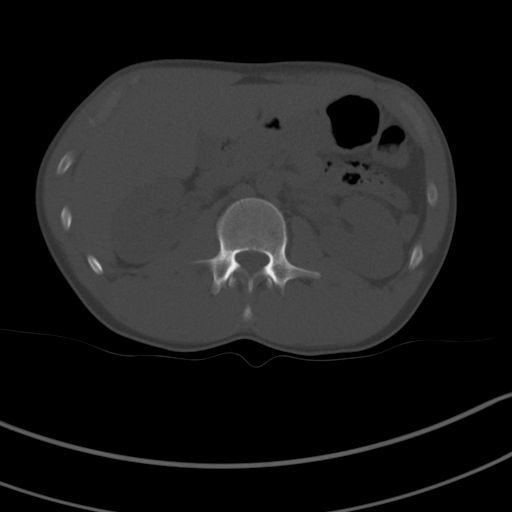
[im 67/87  soft-tissue]
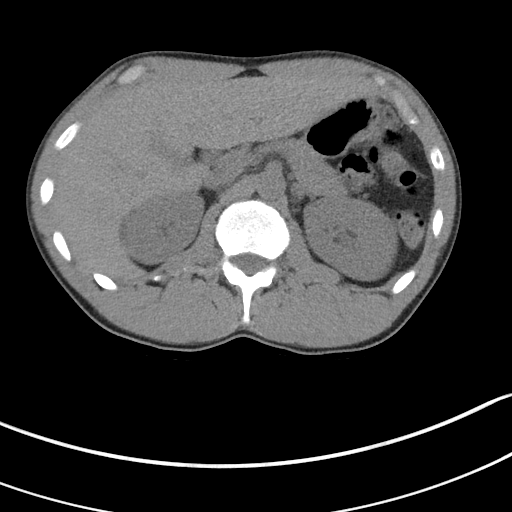
[im 72/87  soft-tissue]
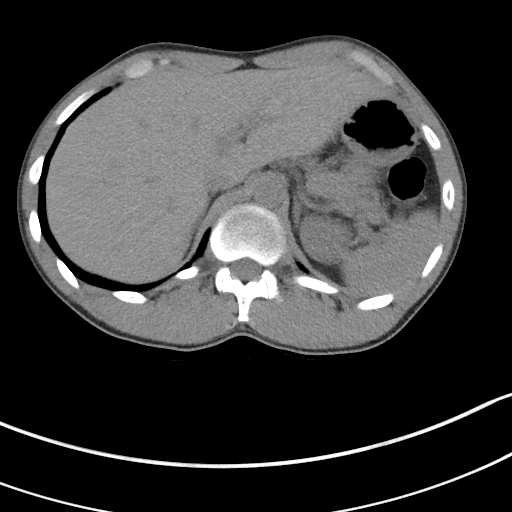
[im 82/87  soft-tissue]
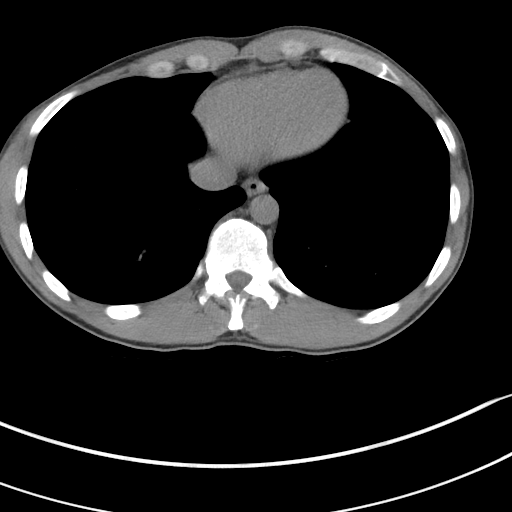

[Series 5: coronal st · coronal · 0.61mm/px · 3 of 82 slices shown]
[im 28/82  soft-tissue]
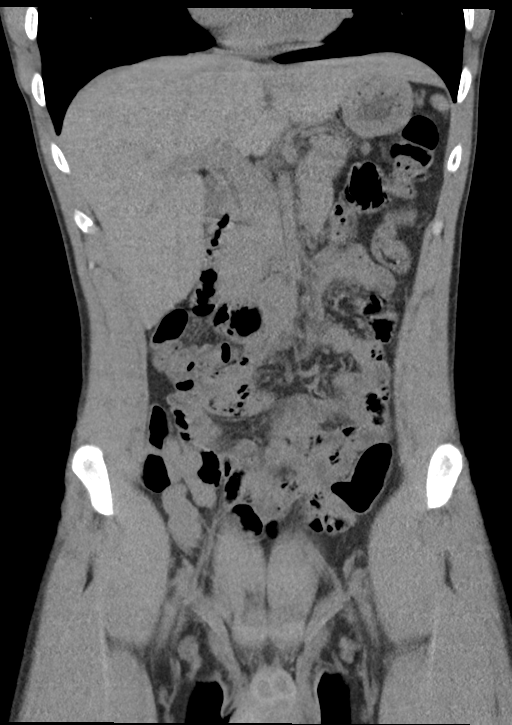
[im 37/82  soft-tissue]
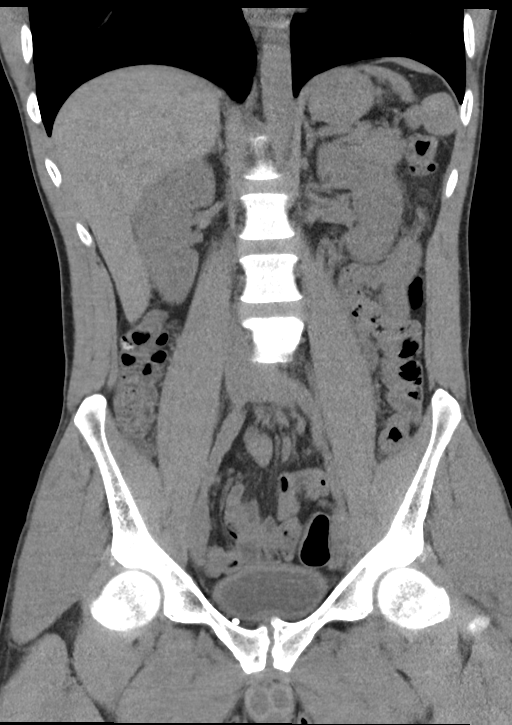
[im 46/82  soft-tissue]
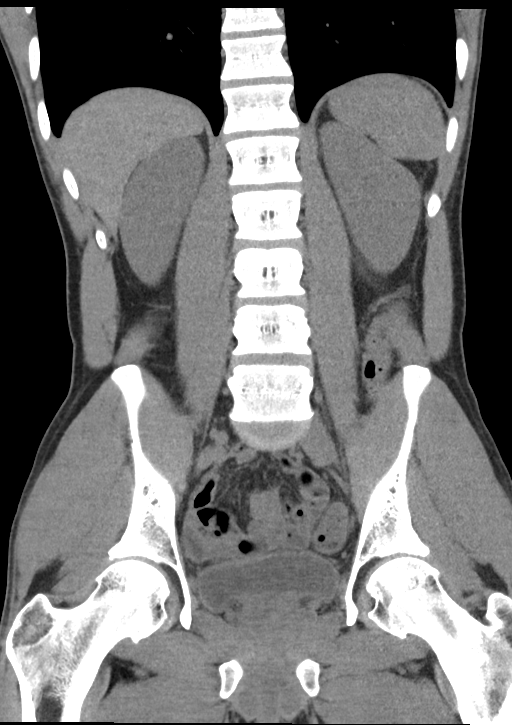

[15 of 46 positions shown; findings below may reference images not displayed]

FINDINGS: Lower chest: Lung bases are clear.

Hepatobiliary: No focal liver lesions are evident on this
noncontrast enhanced study. Gallbladder wall is not appreciably
thickened. There is no biliary duct dilatation.

Pancreas: No pancreatic mass or inflammatory focus.

Spleen: No splenic lesions are evident.

Adrenals/Urinary Tract: Adrenals bilaterally appear normal. There is
no appreciable renal mass or hydronephrosis on either side. There is
no appreciable renal or ureteral calculus on either side. Urinary
bladder is midline with wall thickness within normal limits.

Stomach/Bowel: There is no appreciable bowel wall or mesenteric
thickening. There is no evident bowel obstruction. The terminal
ileum appears normal. There is no evident free air or portal venous
air.

Vascular/Lymphatic: There is no evident abdominal aortic aneurysm.
No arterial vascular lesions are evident. There is a circumaortic
left renal vein, an anatomic variant. No adenopathy is evident in
the abdomen or pelvis.

Reproductive: Prostate and seminal vesicles are normal in size and
configuration.

Other: Appendix appears normal. No abscess or ascites is evident in
the abdomen or pelvis.

Musculoskeletal: No blastic or lytic bone lesions. No intramuscular
lesions.
IMPRESSION: 1. A cause for patient's symptoms has not established with this
study.

2. No evident renal ureteral calculus on either side. There is no
appreciable hydronephrosis. Urinary bladder wall thickness is
normal.

3. No bowel wall thickening or bowel obstruction. No abscess in the
abdomen or pelvis. Appendix region appears normal.

## 2022-02-27 IMAGING — DX DG CHEST 1V PORT
1 series · 1 of 1 positions shown · non-contrast
Comparison: None.

CLINICAL DATA: Chest pain 2 weeks

EXAM:
PORTABLE CHEST 1 VIEW

[chest ap]
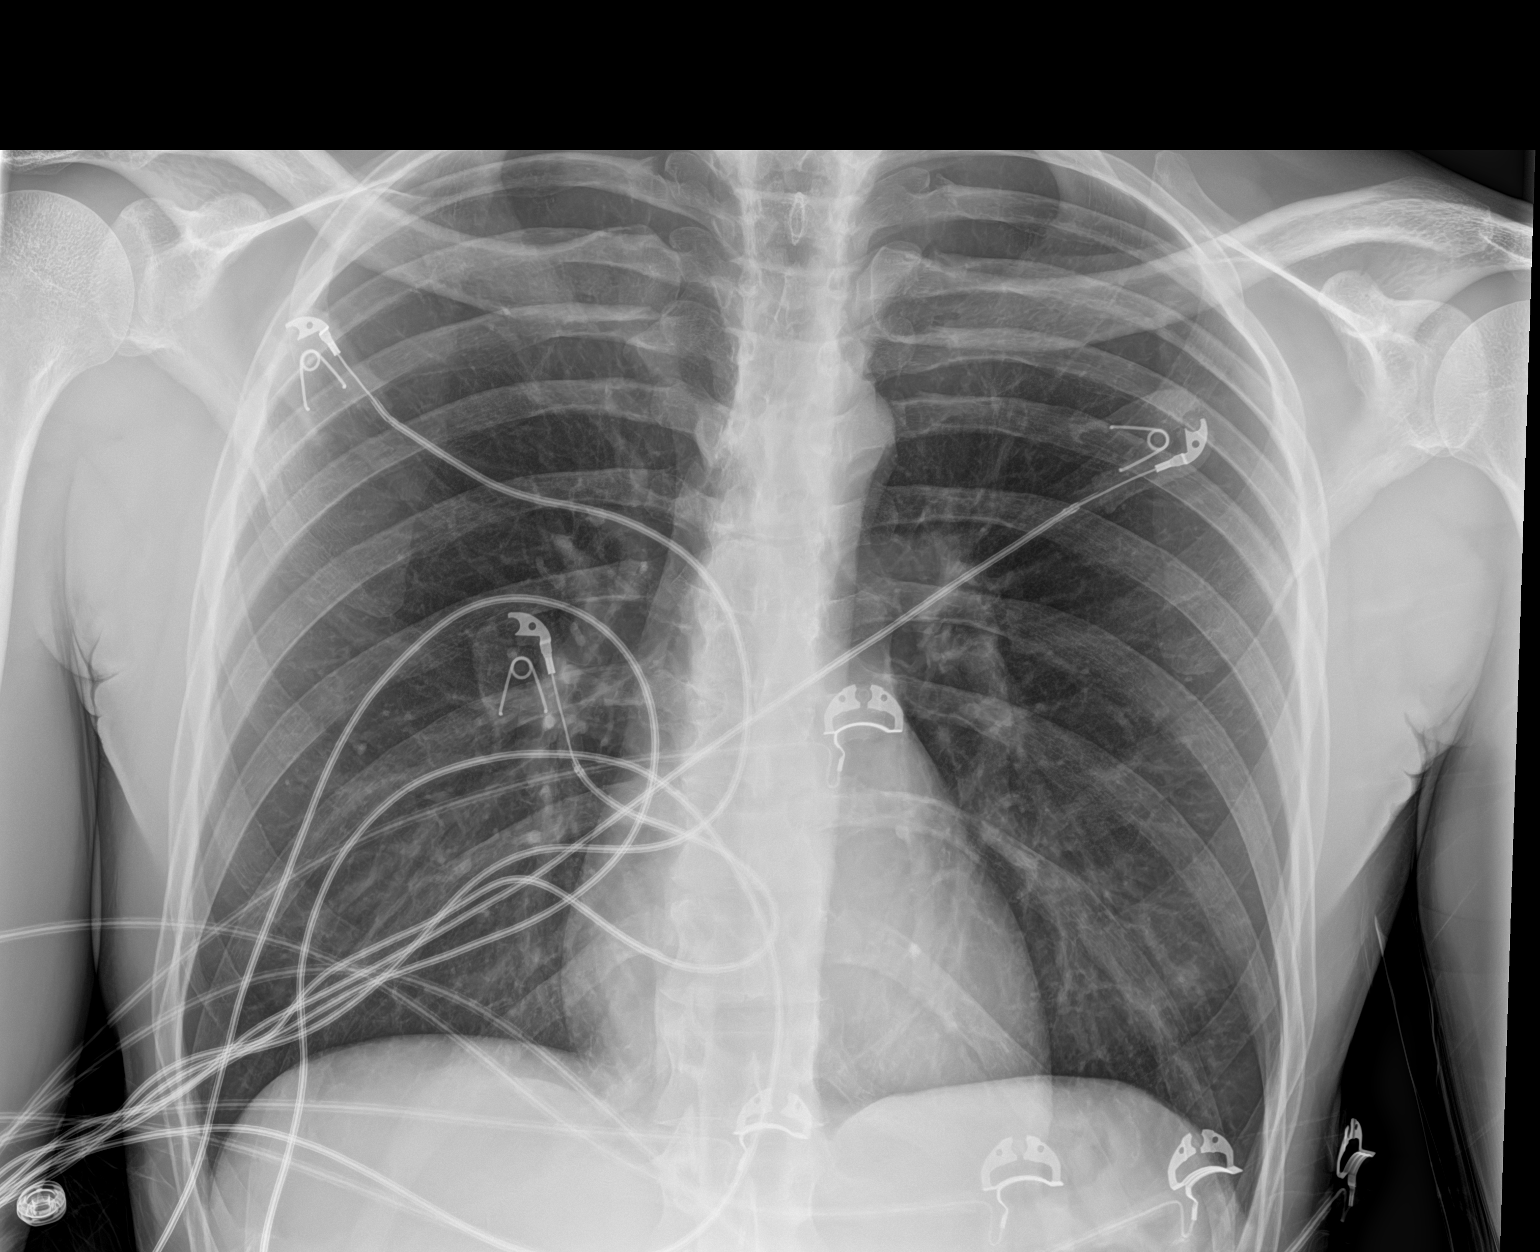

[1 of 1 positions shown; findings below may reference images not displayed]

FINDINGS: The heart size and mediastinal contours are within normal limits.
Both lungs are clear. The visualized skeletal structures are
unremarkable.
IMPRESSION: No active disease.

## 2022-05-23 ENCOUNTER — Encounter (HOSPITAL_COMMUNITY): Payer: Self-pay

## 2022-05-23 ENCOUNTER — Emergency Department (HOSPITAL_COMMUNITY): Payer: Self-pay

## 2022-05-23 ENCOUNTER — Other Ambulatory Visit: Payer: Self-pay

## 2022-05-23 ENCOUNTER — Emergency Department (HOSPITAL_COMMUNITY)
Admission: EM | Admit: 2022-05-23 | Discharge: 2022-05-23 | Disposition: A | Discharge status: Home / Self Care | Payer: Self-pay | Attending: Emergency Medicine | Admitting: Emergency Medicine

## 2022-05-23 DIAGNOSIS — N433 Hydrocele, unspecified: Secondary | ICD-10-CM | POA: Insufficient documentation

## 2022-05-23 DIAGNOSIS — Z79899 Other long term (current) drug therapy: Secondary | ICD-10-CM | POA: Insufficient documentation

## 2022-05-23 LAB — URINALYSIS, ROUTINE W REFLEX MICROSCOPIC
Bacteria, UA: NONE SEEN
Bilirubin Urine: NEGATIVE
Glucose, UA: NEGATIVE mg/dL
Ketones, ur: NEGATIVE mg/dL
Leukocytes,Ua: NEGATIVE
Nitrite: NEGATIVE
Protein, ur: NEGATIVE mg/dL
Specific Gravity, Urine: 1.017 (ref 1.005–1.030)
pH: 5 (ref 5.0–8.0)

## 2022-05-23 NOTE — Discharge Instructions (Signed)
The ultrasound of your scrotum today shows that you have bilateral hydroceles.  I recommend trying scrotal support.  You may follow-up with urology listed.  Tylenol or ibuprofen if needed for discomfort.  Return emergency department for any new or worsening symptoms.

## 2022-05-23 NOTE — ED Provider Notes (Signed)
[facility] EMERGENCY DEPARTMENT Provider Note   CSN: [id number] Arrival date & time: 05/23/22  1214     History  Chief Complaint  Patient presents with   Testicle Pain    Willie Ross is a 36 y.o. male.   Testicle Pain Pertinent negatives include no chest pain, no abdominal pain and no shortness of breath.       Willie Ross is a 36 y.o. male who presents to the Emergency Department complaining of bilateral testicle pain.  States he has had pain of his testicles for 1 to 2 years.  Pain spontaneously resolved but returned several days ago.  States pain has been constant and he describes the pain as throbbing in quality.  Endorses heavy lifting at his job.  He has had some pain of his lower back as well.  He denies any fever, dysuria, penile discharge, genital lesions or rash or new sexual partner.  He has not noticed any swelling of the scrotum or penis.  No known family hx of testicular cancer   Home Medications Prior to Admission medications   Medication Sig Start Date End Date Taking? Authorizing Provider  diclofenac Sodium (VOLTAREN) 1 % GEL Apply 2 g topically 4 (four) times daily as needed. Patient not taking: Reported on 12/08/2020 08/02/20   Maia Plan, MD  ibuprofen (ADVIL) 800 MG tablet Take 1 tablet (800 mg total) by mouth every 8 (eight) hours as needed for moderate pain. Patient not taking: Reported on 12/08/2020 08/02/20   Maia Plan, MD  ondansetron (ZOFRAN ODT) 4 MG disintegrating tablet 4mg  ODT q4 hours prn nausea/vomit Patient not taking: No sig reported 08/10/20   Mesner, 08/12/20, MD  promethazine (PHENERGAN) 25 MG suppository Place 1 suppository (25 mg total) rectally every 6 (six) hours as needed for nausea or vomiting. Patient not taking: No sig reported 08/10/20   Mesner, 08/12/20, MD  promethazine (PHENERGAN) 25 MG tablet Take 1 tablet (25 mg total) by mouth every 6 (six) hours as needed for nausea or vomiting. Patient not taking:  No sig reported 08/10/20   Mesner, 08/12/20, MD      Allergies    Patient has no known allergies.    Review of Systems   Review of Systems  Constitutional:  Negative for chills and fever.  Respiratory:  Negative for shortness of breath.   Cardiovascular:  Negative for chest pain.  Gastrointestinal:  Negative for abdominal pain.  Genitourinary:  Positive for testicular pain. Negative for difficulty urinating, dysuria, hematuria, penile discharge, penile pain, penile swelling and scrotal swelling.  Musculoskeletal:  Positive for back pain.  Skin:  Negative for color change and rash.  Neurological:  Negative for dizziness, weakness and numbness.    Physical Exam Updated Vital Signs BP 110/76 (BP Location: Right Arm)   Pulse 95   Temp 97.9 F (36.6 C) (Oral)   Resp 16   Ht 6' (1.829 m)   Wt 63.5 kg   SpO2 100%   BMI 18.99 kg/m  Physical Exam Vitals and nursing note reviewed. Exam conducted with a chaperone present.  Constitutional:      General: He is not in acute distress.    Appearance: Normal appearance. He is not ill-appearing or toxic-appearing.  Cardiovascular:     Rate and Rhythm: Normal rate and regular rhythm.     Pulses: Normal pulses.  Pulmonary:     Effort: Pulmonary effort is normal.  Abdominal:     General: There is no distension.  Palpations: Abdomen is soft.     Tenderness: There is no abdominal tenderness. There is no right CVA tenderness or left CVA tenderness.     Hernia: There is no hernia in the left inguinal area or right inguinal area.  Genitourinary:    Penis: Circumcised. No tenderness, discharge, swelling or lesions.      Testes: Cremasteric reflex is present.        Right: Mass, tenderness or swelling not present.        Left: Mass, tenderness or swelling not present.     Epididymis:     Right: No tenderness.     Left: No tenderness.     Comments: I do not appreciate any genital lesions or rash, penile discharge, palpable masses of the  testes or palpable hernias. Skin:    General: Skin is warm.     Capillary Refill: Capillary refill takes less than 2 seconds.     Findings: No erythema or rash.  Neurological:     General: No focal deficit present.     Mental Status: He is alert.     ED Results / Procedures / Treatments   Labs (all labs ordered are listed, but only abnormal results are displayed) Labs Reviewed  URINALYSIS, ROUTINE W REFLEX MICROSCOPIC - Abnormal; Notable for the following components:      Result Value   Hgb urine dipstick SMALL (*)    All other components within normal limits  GC/CHLAMYDIA PROBE AMP ([place]) NOT AT [facility]    EKG None  Radiology US SCROTUM W/DOPPLER  Result Date: 05/23/2022 CLINICAL DATA:  Testicular pain bilaterally. EXAM: SCROTAL ULTRASOUND DOPPLER ULTRASOUND OF THE TESTICLES TECHNIQUE: Complete ultrasound examination of the testicles, epididymis, and other scrotal structures was performed. Color and spectral Doppler ultrasound were also utilized to evaluate blood flow to the testicles. COMPARISON:  None Available. FINDINGS: Right testicle Measurements: 4.6 x 2.0 x 4.1 cm. No mass or microlithiasis visualized. Left testicle Measurements: 2.9 x 2.0 x 2.9 cm. No mass or microlithiasis visualized. Right epididymis:  Normal in size and appearance. Left epididymis:  Normal in size and appearance. Hydrocele:  Small bilateral hydrocele Varicocele:  None visualized. Pulsed Doppler interrogation of both testes demonstrates normal low resistance arterial and venous waveforms bilaterally. IMPRESSION: Negative for testicular mass or torsion. Small hydrocele bilaterally Electronically Signed   By: Franchot Gallo M.D.   On: 05/23/2022 14:18    Procedures Procedures    Medications Ordered in ED Medications - No data to display  ED Course/ Medical Decision Making/ A&P                           Medical Decision Making Patient here for evaluation of bilateral testicular pain.  Symptoms  been present for 1 to 2 years spontaneously resolved but then returned couple of days ago.  He does endorse heavy lifting at his job.  Denies any dysuria, penile discharge or new sexual partner.  On exam, no palpable masses or obvious edema to the scrotum.  Bilateral testes are nontender on my exam.  I do not appreciate any palpable inguinal hernias.  Differential diagnosis includes but not limited to hydrocele, varicocele, testicular torsion, testicular mass, epididymitis, urethritis  Amount and/or Complexity of Data Reviewed Labs: ordered.    Details: Labs interpreted by me, urinalysis without evidence of infection.  GC chlamydia cultures pending Radiology: ordered.    Details: Scrotal ultrasound with Doppler ordered for further evaluation.  Small  bilateral hydroceles present without evidence of testicular mass or torsion. Discussion of management or test interpretation with external provider(s): Patient here for evaluation of bilateral testicular pain.  No history of trauma or injury.  Ultrasound shows bilateral hydroceles.  GC chlamydia culture pending.  Discussed findings with patient he is agreeable with treatment plan and outpatient follow-up with urology if needed.           Final Clinical Impression(s) / ED Diagnoses Final diagnoses:  Hydrocele, bilateral    Rx / DC Orders ED Discharge Orders     None         Pauline Aus, PA-C 05/23/22 1531    Loetta Rough, MD 05/23/22 2101

## 2022-05-23 NOTE — ED Triage Notes (Signed)
Pt presents to ED with complaints of bilateral testicle pain x 1-2 years

## 2022-05-23 NOTE — ED Notes (Signed)
ED Provider at bedside. 

## 2022-05-24 LAB — GC/CHLAMYDIA PROBE AMP (~~LOC~~) NOT AT ARMC
Chlamydia: NEGATIVE
Comment: NEGATIVE
Comment: NORMAL
Neisseria Gonorrhea: NEGATIVE

## 2022-07-07 ENCOUNTER — Other Ambulatory Visit: Payer: Self-pay

## 2022-07-07 ENCOUNTER — Encounter (HOSPITAL_COMMUNITY): Payer: Self-pay

## 2022-07-07 ENCOUNTER — Emergency Department (HOSPITAL_COMMUNITY)
Admission: EM | Admit: 2022-07-07 | Discharge: 2022-07-07 | Disposition: A | Discharge status: Home / Self Care | Payer: BC Managed Care – PPO | Attending: Emergency Medicine | Admitting: Emergency Medicine

## 2022-07-07 DIAGNOSIS — L02414 Cutaneous abscess of left upper limb: Secondary | ICD-10-CM | POA: Diagnosis present

## 2022-07-07 MED ORDER — DOXYCYCLINE HYCLATE 100 MG PO TABS
100.0000 mg | ORAL_TABLET | Freq: Once | ORAL | Status: AC
  Administered 2022-07-07: 100 mg via ORAL
  Filled 2022-07-07: qty 1

## 2022-07-07 MED ORDER — DOXYCYCLINE HYCLATE 100 MG PO CAPS: 100.0000 mg | ORAL_CAPSULE | Freq: Two times a day (BID) | ORAL | 0 refills | Status: DC

## 2022-07-07 NOTE — ED Provider Notes (Signed)
  [facility] EMERGENCY DEPARTMENT Provider Note   CSN: [id number] Arrival date & time: 07/07/22  0507     History  Chief Complaint  Patient presents with   Abscess    Willie Ross is a 36 y.o. male.   Abscess Patient reports he has had a "boil "on his left arm for the past 3 days. He reports it is now starting to drain.  He reports increased pain and redness.  No fevers or vomiting.  He is not diabetic.  He does not inject drugs.     Home Medications Prior to Admission medications   Medication Sig Start Date End Date Taking? Authorizing Provider  doxycycline (VIBRAMYCIN) 100 MG capsule Take 1 capsule (100 mg total) by mouth 2 (two) times daily. 07/07/22  Yes Zadie Rhine, MD      Allergies    Patient has no known allergies.    Review of Systems   Review of Systems  Physical Exam Updated Vital Signs BP (!) 109/90 (BP Location: Left Arm)   Pulse 99   Temp 98.5 F (36.9 C) (Oral)   Resp 17   SpO2 99%  Physical Exam CONSTITUTIONAL: Well developed/well nourished HEAD: Normocephalic/atraumatic NEURO: Pt is awake/alert/appropriate, moves all extremitiesx4.  No facial droop.   EXTREMITIES: pulses normal/equal, full ROM Draining abscess noted to the left upper extremity just above the elbow.  He has full range of motion of the elbow.  No signs of bursitis. There is no crepitus.  Mild erythema is noted.  See photo SKIN: warm, see photo PSYCH: Anxious    ED Results / Procedures / Treatments   Labs (all labs ordered are listed, but only abnormal results are displayed) Labs Reviewed - No data to display  EKG None  Radiology No results found.  Procedures Procedures    Medications Ordered in ED Medications  doxycycline (VIBRA-TABS) tablet 100 mg (has no administration in time range)    ED Course/ Medical Decision Making/ A&P                           Medical Decision Making Risk Prescription drug management.   Patient presents with an  abscess is already spontaneously draining.  There is no fever.  No crepitus.  He can fully range the left elbow. He is safe for outpatient management.  We discussed wound care precautions.  We will add on doxycycline.  We discussed strict return precautions        Final Clinical Impression(s) / ED Diagnoses Final diagnoses:  Abscess of left arm    Rx / DC Orders ED Discharge Orders          Ordered    doxycycline (VIBRAMYCIN) 100 MG capsule  2 times daily        07/07/22 0547              Zadie Rhine, MD 07/07/22 [phone]

## 2022-07-07 NOTE — ED Triage Notes (Signed)
Pt arrived from home via POV w c/o open wound on LUE. Pt thinks it was a boil/abscess that busted on it's own and is now draining.

## 2023-12-23 ENCOUNTER — Emergency Department (HOSPITAL_COMMUNITY)
Admission: EM | Admit: 2023-12-23 | Discharge: 2023-12-23 | Disposition: A | Discharge status: Home / Self Care | Payer: Self-pay | Attending: Emergency Medicine | Admitting: Emergency Medicine

## 2023-12-23 ENCOUNTER — Encounter (HOSPITAL_COMMUNITY): Payer: Self-pay | Admitting: *Deleted

## 2023-12-23 ENCOUNTER — Other Ambulatory Visit: Payer: Self-pay

## 2023-12-23 DIAGNOSIS — L02811 Cutaneous abscess of head [any part, except face]: Secondary | ICD-10-CM | POA: Insufficient documentation

## 2023-12-23 DIAGNOSIS — L0291 Cutaneous abscess, unspecified: Secondary | ICD-10-CM

## 2023-12-23 MED ORDER — IBUPROFEN 400 MG PO TABS
600.0000 mg | ORAL_TABLET | Freq: Once | ORAL | Status: AC
  Administered 2023-12-23: 600 mg via ORAL
  Filled 2023-12-23: qty 2

## 2023-12-23 MED ORDER — DOXYCYCLINE HYCLATE 100 MG PO CAPS: 100.0000 mg | ORAL_CAPSULE | Freq: Two times a day (BID) | ORAL | 0 refills | Status: AC

## 2023-12-23 MED ORDER — DOXYCYCLINE HYCLATE 100 MG PO TABS
100.0000 mg | ORAL_TABLET | Freq: Once | ORAL | Status: AC
  Administered 2023-12-23: 100 mg via ORAL
  Filled 2023-12-23: qty 1

## 2023-12-23 MED ORDER — IBUPROFEN 600 MG PO TABS: 600.0000 mg | ORAL_TABLET | Freq: Four times a day (QID) | ORAL | 0 refills | Status: AC | PRN

## 2023-12-23 MED ORDER — LIDOCAINE HCL (PF) 1 % IJ SOLN
30.0000 mL | Freq: Once | INTRAMUSCULAR | Status: AC
  Administered 2023-12-23: 30 mL
  Filled 2023-12-23: qty 30

## 2023-12-23 NOTE — Procedures (Signed)
 Discharge instructions explained. Discharged with partner.

## 2023-12-23 NOTE — ED Provider Notes (Signed)
 [place] EMERGENCY DEPARTMENT AT [facility] Provider Note   CSN: [id number] Arrival date & time: 12/23/23  1248     History  Chief Complaint  Patient presents with   Abscess    Willie Ross is a 38 y.o. male. He denies to significant PMH.  Presents the ER for a left jaw abscess.  It started about 4 days ago.  He states it was small and was like previous hair bumps he has had in the past after letting his facial hair grow back.  He states normally goes away on its own.  His PCP started him on antibiotics, Augmentin.  He states that has gotten bigger since then, and is now draining a small amount.  Denies fever or chills, trouble swallowing or breathing.  No dental pain.  No voice changes.  Abscess      Home Medications Prior to Admission medications   Medication Sig Start Date End Date Taking? Authorizing Provider  doxycycline  (VIBRAMYCIN ) 100 MG capsule Take 1 capsule (100 mg total) by mouth 2 (two) times daily. 12/23/23  Yes Diona Peregoy A, PA-C  ibuprofen  (ADVIL ) 600 MG tablet Take 1 tablet (600 mg total) by mouth every 6 (six) hours as needed. 12/23/23  Yes Darrik Richman A, PA-C      Allergies    Patient has no known allergies.    Review of Systems   Review of Systems  Physical Exam Updated Vital Signs BP 116/73 (BP Location: Right Arm)   Pulse 94   Temp 98.3 F (36.8 C) (Oral)   Resp 18   Ht 6' (1.829 m)   Wt 59 kg   SpO2 98%   BMI 17.63 kg/m  Physical Exam Vitals and nursing note reviewed.  Constitutional:      General: He is not in acute distress.    Appearance: He is well-developed.  HENT:     Head: Normocephalic and atraumatic.     Comments: Approximately 3 cm diameter fluctuant mass with small amount of drainage to left mandible.  No sublingual or submental induration tenderness or crepitus.    Nose: Nose normal.     Mouth/Throat:     Mouth: Mucous membranes are moist.     Pharynx: Oropharynx is clear. No oropharyngeal  exudate or posterior oropharyngeal erythema.     Comments: Patient speaks in full and clear sentences, handles oral secretions well. Eyes:     Conjunctiva/sclera: Conjunctivae normal.  Cardiovascular:     Rate and Rhythm: Normal rate and regular rhythm.     Heart sounds: No murmur heard. Pulmonary:     Effort: Pulmonary effort is normal. No respiratory distress.     Breath sounds: Normal breath sounds.  Abdominal:     Palpations: Abdomen is soft.     Tenderness: There is no abdominal tenderness.  Musculoskeletal:        General: No swelling.     Cervical back: Neck supple.  Skin:    General: Skin is warm and dry.     Capillary Refill: Capillary refill takes less than 2 seconds.  Neurological:     Mental Status: He is alert.  Psychiatric:        Mood and Affect: Mood normal.     ED Results / Procedures / Treatments   Labs (all labs ordered are listed, but only abnormal results are displayed) Labs Reviewed - No data to display  EKG None  Radiology No results found.  Procedures .Incision and Drainage  Date/Time: 12/23/2023  2:08 PM  Performed by: Aimee Houseman, PA-C Authorized by: Aimee Houseman, PA-C   Consent:    Consent obtained:  Verbal   Consent given by:  Patient   Risks discussed:  Bleeding, incomplete drainage, pain, damage to other organs and infection   Alternatives discussed:  No treatment and observation Universal protocol:    Procedure explained and questions answered to patient or proxy's satisfaction: yes     Relevant documents present and verified: yes     Required blood products, implants, devices, and special equipment available: yes     Site/side marked: yes     Immediately prior to procedure, a time out was called: yes     Patient identity confirmed:  Verbally with patient Location:    Type:  Abscess   Location:  Head   Head/neck location: left mandible. Pre-procedure details:    Skin preparation:  Betadine  Sedation:    Sedation  type:  None Anesthesia:    Anesthesia method:  Local infiltration   Local anesthetic:  Lidocaine  1% WITH epi and lidocaine  1% w/o epi Procedure type:    Complexity:  Complex Procedure details:    Incision types:  Single straight   Incision depth:  Subcutaneous   Wound management:  Probed and deloculated, irrigated with saline and extensive cleaning   Drainage:  Purulent   Drainage amount:  Moderate   Packing materials:  1/4 in iodoform gauze Post-procedure details:    Procedure completion:  Tolerated well, no immediate complications     Medications Ordered in ED Medications  lidocaine  (PF) (XYLOCAINE ) 1 % injection 30 mL (has no administration in time range)  doxycycline  (VIBRA -TABS) tablet 100 mg (has no administration in time range)  ibuprofen  (ADVIL ) tablet 600 mg (has no administration in time range)    ED Course/ Medical Decision Making/ A&P                                 Medical Decision Making Differential diagnosis includes but not limited to abscess, cellulitis, epidermal inclusion cyst, lipoma, dental abscess, other ED course: Patient here with left jaw abscess.  PCP started Augmentin, presumably for the symptom of dental abscess.  This seems to be more of a skin abscess.  I offered I&D versus switching antibiotics and continuing warm compresses as he is having some spontaneous drainage.  He preferred I&D.  We discussed risks and benefits of I&D including infection, bleeding, nerve damage or vascular damage,, scarring.  Elected proceed with the I&D.  He tolerated this well, will switch over Augmentin to doxycycline .  Advised on follow-up in 2 days for packing removal and wound recheck either in the ED or urgent care or to return sooner for increased pain or swelling, fever, any trouble swallowing or breathing or any other worsening symptoms.  We discussed due to the location this is high risk area so he should have low threshold to return.  Amount and/or Complexity of  Data Reviewed External Data Reviewed: notes.  Risk Prescription drug management.           Final Clinical Impression(s) / ED Diagnoses Final diagnoses:  Abscess    Rx / DC Orders ED Discharge Orders          Ordered    doxycycline  (VIBRAMYCIN ) 100 MG capsule  2 times daily        12/23/23 1413    ibuprofen  (ADVIL ) 600 MG tablet  Every 6  hours PRN        12/23/23 1413              Joshua Nieves 12/23/23 1414    Cheyenne Cotta, MD 12/24/23 1754

## 2023-12-23 NOTE — ED Triage Notes (Signed)
 Pt with abscess to left jawline x 4 days with minimal drainage. Pt seen PCP and started on antibiotic on 5/8 for Augmentin.

## 2023-12-23 NOTE — Discharge Instructions (Signed)
 Is a pleasure taking care of you today.  You seen for an abscess on your jaw.  We incised and drained this today.  Stop the Augmentin and start taking the doxycycline .  Keep the area clean and dry and change the dressing as needed.  You need to have a recheck in 2 days to reevaluate the wound.  Please come back to the ER sooner if you have fever, increased redness or swelling, trouble swallowing or breathing, severe pain or any other worrisome changes.  You were prescribed ibuprofen  for pain and inflammation.  You  can also take over-the-counter Tylenol as directed on packaging for discomfort.

## 2024-03-14 ENCOUNTER — Emergency Department (HOSPITAL_COMMUNITY)

## 2024-03-14 ENCOUNTER — Other Ambulatory Visit: Payer: Self-pay

## 2024-03-14 ENCOUNTER — Emergency Department (HOSPITAL_COMMUNITY)
Admission: EM | Admit: 2024-03-14 | Discharge: 2024-03-14 | Disposition: A | Discharge status: Home / Self Care | Attending: Emergency Medicine | Admitting: Emergency Medicine

## 2024-03-14 ENCOUNTER — Encounter (HOSPITAL_COMMUNITY): Payer: Self-pay

## 2024-03-14 DIAGNOSIS — F17210 Nicotine dependence, cigarettes, uncomplicated: Secondary | ICD-10-CM | POA: Diagnosis not present

## 2024-03-14 DIAGNOSIS — R42 Dizziness and giddiness: Secondary | ICD-10-CM | POA: Diagnosis present

## 2024-03-14 LAB — CBC WITH DIFFERENTIAL/PLATELET
Abs Immature Granulocytes: 0.01 K/uL (ref 0.00–0.07)
Basophils Absolute: 0 K/uL (ref 0.0–0.1)
Basophils Relative: 1 %
Eosinophils Absolute: 0.2 K/uL (ref 0.0–0.5)
Eosinophils Relative: 3 %
HCT: 37.8 % — ABNORMAL LOW (ref 39.0–52.0)
Hemoglobin: 13 g/dL (ref 13.0–17.0)
Immature Granulocytes: 0 %
Lymphocytes Relative: 33 %
Lymphs Abs: 1.9 K/uL (ref 0.7–4.0)
MCH: 32.7 pg (ref 26.0–34.0)
MCHC: 34.4 g/dL (ref 30.0–36.0)
MCV: 95 fL (ref 80.0–100.0)
Monocytes Absolute: 0.3 K/uL (ref 0.1–1.0)
Monocytes Relative: 6 %
Neutro Abs: 3.4 K/uL (ref 1.7–7.7)
Neutrophils Relative %: 57 %
Platelets: 209 K/uL (ref 150–400)
RBC: 3.98 MIL/uL — ABNORMAL LOW (ref 4.22–5.81)
RDW: 13.9 % (ref 11.5–15.5)
WBC: 5.8 K/uL (ref 4.0–10.5)
nRBC: 0 % (ref 0.0–0.2)

## 2024-03-14 LAB — COMPREHENSIVE METABOLIC PANEL WITH GFR
ALT: 20 U/L (ref 0–44)
AST: 25 U/L (ref 15–41)
Albumin: 4.3 g/dL (ref 3.5–5.0)
Alkaline Phosphatase: 59 U/L (ref 38–126)
Anion gap: 17 — ABNORMAL HIGH (ref 5–15)
BUN: 9 mg/dL (ref 6–20)
CO2: 22 mmol/L (ref 22–32)
Calcium: 9.2 mg/dL (ref 8.9–10.3)
Chloride: 99 mmol/L (ref 98–111)
Creatinine, Ser: 0.81 mg/dL (ref 0.61–1.24)
GFR, Estimated: >60 mL/min (ref >60)
Glucose, Bld: 85 mg/dL (ref 70–99)
Potassium: 3.8 mmol/L (ref 3.5–5.1)
Sodium: 138 mmol/L (ref 135–145)
Total Bilirubin: 1 mg/dL (ref 0.0–1.2)
Total Protein: 7.7 g/dL (ref 6.5–8.1)

## 2024-03-14 LAB — TROPONIN I (HIGH SENSITIVITY): Troponin I (High Sensitivity): <2 ng/L (ref <18)

## 2024-03-14 MED ORDER — LACTATED RINGERS IV BOLUS: 1000.0000 mL | Freq: Once | INTRAVENOUS | Status: DC

## 2024-03-14 NOTE — ED Provider Notes (Signed)
 [place] EMERGENCY DEPARTMENT AT [facility] Provider Note   CSN: [id number] Arrival date & time: 03/14/24  1500     Patient presents with: Hypertension   Willie Ross is a 38 y.o. male.  The ER complaining of dizziness that started about 2:30 PM at work.  He works at [employer] and does a lot of walking in a and extremely hot environment and was doing more exertion today than normal and had not had a break yet.  He started feeling very lightheaded and that his manager told him to go get his blood pressure checked.  He is still feeling poorly and had some chest pain at the time was sent to the ER for further evaluation.  He states since arriving in the ER he is feeling much better.  He has no chest pain, shortness of breath, no extremity swelling or pain.  No nausea or vomiting.  He denies any vertigo, no abdominal pain, no urinary symptoms.  Patient does report he drinks beer daily but usually just a couple beers prior to bed, had not had any alcohol today, denies other drug use.  He does smoke cigarettes.    Hypertension       Prior to Admission medications   Medication Sig Start Date End Date Taking? Authorizing Provider  doxycycline  (VIBRAMYCIN ) 100 MG capsule Take 1 capsule (100 mg total) by mouth 2 (two) times daily. 12/23/23   Suellen Cantor A, PA-C  ibuprofen  (ADVIL ) 600 MG tablet Take 1 tablet (600 mg total) by mouth every 6 (six) hours as needed. 12/23/23   Suellen Cantor A, PA-C    Allergies: Patient has no known allergies.    Review of Systems  Updated Vital Signs BP (!) 134/90 (BP Location: Right Arm)   Pulse 78   Temp 98.8 F (37.1 C) (Oral)   Resp 18   Ht 6' (1.829 m)   Wt 59 kg   SpO2 100%   BMI 17.63 kg/m   Physical Exam Vitals and nursing note reviewed.  Constitutional:      General: He is not in acute distress.    Appearance: He is well-developed.  HENT:     Head: Normocephalic and atraumatic.     Mouth/Throat:     Mouth:  Mucous membranes are moist.  Eyes:     Conjunctiva/sclera: Conjunctivae normal.  Cardiovascular:     Rate and Rhythm: Normal rate and regular rhythm.     Heart sounds: No murmur heard. Pulmonary:     Effort: Pulmonary effort is normal. No respiratory distress.     Breath sounds: Normal breath sounds.  Abdominal:     Palpations: Abdomen is soft.     Tenderness: There is no abdominal tenderness.  Musculoskeletal:        General: No swelling.     Cervical back: Neck supple.     Right lower leg: No edema.     Left lower leg: No edema.  Skin:    General: Skin is warm and dry.     Capillary Refill: Capillary refill takes less than 2 seconds.  Neurological:     General: No focal deficit present.     Mental Status: He is alert and oriented to person, place, and time.  Psychiatric:        Mood and Affect: Mood normal.     (all labs ordered are listed, but only abnormal results are displayed) Labs Reviewed  CBC WITH DIFFERENTIAL/PLATELET - Abnormal; Notable for the following components:  Result Value   RBC 3.98 (*)    HCT 37.8 (*)    All other components within normal limits  COMPREHENSIVE METABOLIC PANEL WITH GFR  TROPONIN I (HIGH SENSITIVITY)    EKG: EKG Interpretation Date/Time:  Friday March 14 2024 15:24:37 EDT Ventricular Rate:  73 PR Interval:  186 QRS Duration:  78 QT Interval:  362 QTC Calculation: 399 R Axis:   78  Text Interpretation: Sinus or ectopic atrial rhythm Consider left ventricular hypertrophy COPY Confirmed by Towana Sharper [phone]) on 03/14/2024 3:36:30 PM  Radiology: No results found.   Procedures   Medications Ordered in the ED - No data to display                                  Medical Decision Making Differential diagnosis includes but not limited to dehydration, ACS, heat exhaustion, rhabdomyolysis, other ED course: Patient here with nonspecific symptoms of feeling generally dizzy while working in a very hot environment  excessively sweating.  He did have chest pain in the time which is now resolved.  He is low risk for heart score, troponin is negative, EKG is reassuring, his x-ray negative for pulmonary artery or pneumothorax per my interpretation.  I agree with radiology read.  He is no significant anemia, no AKI and normal electrolytes.  His only lab abnormality that was significant was slightly increased anion gap of 17.  He denies any salicylate intake, alcohol use today, he is not in DKA, this is likely due to dehydration we will give him IV fluids, he appears very well and has reassuring vitals.  Plan on discharge with follow-up with PCP.  Urged frequent breaks, oral rehydration and drinking plenty of fluids while at work.    Amount and/or Complexity of Data Reviewed Labs: ordered. Radiology: ordered.        Final diagnoses:  None    ED Discharge Orders     None          Suellen Sherran DELENA DEVONNA 03/14/24 1754    Towana Sharper BROCKS, MD 03/15/24 1048

## 2024-03-14 NOTE — ED Triage Notes (Signed)
 Pt was working and became dizzy. Staff took his BP was elevated (130/66). Pt states he did have CP but no longer has it. Pt is A&Ox4. Pt denies any hx of HTN or cardiac issues.

## 2024-03-14 NOTE — ED Notes (Signed)
 Patient tolerating po fluids , pt drink 8 oz of soda. No problems  noted.

## 2024-03-14 NOTE — Discharge Instructions (Addendum)
 Pleasure take care of you today.  You are seen for dizziness today her blood work with some chest pain.  Fortunately your workup here was very reassuring.  I do think you were likely somewhat dehydrated.  Drink plenty of fluids and rest.  Since he did not want to have any fluids it is important that you drink lots of water.  Come back to the ER for new or worsening symptoms.

## 2024-04-30 ENCOUNTER — Emergency Department (HOSPITAL_COMMUNITY): Admission: EM | Admit: 2024-04-30 | Discharge: 2024-04-30 | Disposition: A | Discharge status: Home / Self Care

## 2024-04-30 ENCOUNTER — Encounter (HOSPITAL_COMMUNITY): Payer: Self-pay

## 2024-04-30 ENCOUNTER — Other Ambulatory Visit: Payer: Self-pay

## 2024-04-30 ENCOUNTER — Emergency Department (HOSPITAL_COMMUNITY)

## 2024-04-30 DIAGNOSIS — Z87891 Personal history of nicotine dependence: Secondary | ICD-10-CM | POA: Insufficient documentation

## 2024-04-30 DIAGNOSIS — R079 Chest pain, unspecified: Secondary | ICD-10-CM | POA: Diagnosis not present

## 2024-04-30 DIAGNOSIS — R202 Paresthesia of skin: Secondary | ICD-10-CM | POA: Insufficient documentation

## 2024-04-30 LAB — CBC WITH DIFFERENTIAL/PLATELET
Abs Immature Granulocytes: 0.01 K/uL (ref 0.00–0.07)
Basophils Absolute: 0.1 K/uL (ref 0.0–0.1)
Basophils Relative: 1 %
Eosinophils Absolute: 0.2 K/uL (ref 0.0–0.5)
Eosinophils Relative: 3 %
HCT: 37.6 % — ABNORMAL LOW (ref 39.0–52.0)
Hemoglobin: 12.8 g/dL — ABNORMAL LOW (ref 13.0–17.0)
Immature Granulocytes: 0 %
Lymphocytes Relative: 39 %
Lymphs Abs: 2.2 K/uL (ref 0.7–4.0)
MCH: 31.6 pg (ref 26.0–34.0)
MCHC: 34 g/dL (ref 30.0–36.0)
MCV: 92.8 fL (ref 80.0–100.0)
Monocytes Absolute: 0.6 K/uL (ref 0.1–1.0)
Monocytes Relative: 11 %
Neutro Abs: 2.5 K/uL (ref 1.7–7.7)
Neutrophils Relative %: 46 %
Platelets: 230 K/uL (ref 150–400)
RBC: 4.05 MIL/uL — ABNORMAL LOW (ref 4.22–5.81)
RDW: 13.9 % (ref 11.5–15.5)
WBC: 5.5 K/uL (ref 4.0–10.5)
nRBC: 0 % (ref 0.0–0.2)

## 2024-04-30 LAB — BASIC METABOLIC PANEL WITH GFR
Anion gap: 16 — ABNORMAL HIGH (ref 5–15)
BUN: 11 mg/dL (ref 6–20)
CO2: 26 mmol/L (ref 22–32)
Calcium: 8.9 mg/dL (ref 8.9–10.3)
Chloride: 97 mmol/L — ABNORMAL LOW (ref 98–111)
Creatinine, Ser: 0.9 mg/dL (ref 0.61–1.24)
GFR, Estimated: >60 mL/min (ref >60)
Glucose, Bld: 96 mg/dL (ref 70–99)
Potassium: 3.7 mmol/L (ref 3.5–5.1)
Sodium: 139 mmol/L (ref 135–145)

## 2024-04-30 LAB — TROPONIN I (HIGH SENSITIVITY): Troponin I (High Sensitivity): <2 ng/L (ref <18)

## 2024-04-30 NOTE — ED Triage Notes (Signed)
 Reports left arm has been going numb for awhile and having a tingling sensation but also having chest pain.  +nausea vomiting.

## 2024-04-30 NOTE — ED Provider Notes (Signed)
 [place] EMERGENCY DEPARTMENT AT [facility] Provider Note   CSN: [id number] Arrival date & time: 04/30/24  8177     Patient presents with: Numbness   Willie Ross is a 38 y.o. male.  Presents the ER today complaining of chest pain that last for less than 5 seconds at a time that started today while at work.  He is having no chest pain at this time, no shortness of breath, pain is not pleuritic, not associated with any lower extremity pain or swelling.  No cough, no fever, no hemoptysis.  Pain does not radiate and is located in the center of his chest.  Patient states he apparently came in for chest pain but also wanted to note that he is having left arm numbness and tingling from the shoulder to the fingertips that is intermittent for the past 3 weeks, right now it is not feeling numb or tingly.  It is not associated with any neck pain it is not exertional, it is not stated with any other neurologic findings such as vision, speech changes or any other numbness or tingling.  He is not have any issues with his balance.  No headaches.  Patient is a smoker.   {Add pertinent medical, surgical, social history, OB history to HPI:32947} HPI     Prior to Admission medications   Medication Sig Start Date End Date Taking? Authorizing Provider  doxycycline  (VIBRAMYCIN ) 100 MG capsule Take 1 capsule (100 mg total) by mouth 2 (two) times daily. 12/23/23   Suellen Cantor A, PA-C  ibuprofen  (ADVIL ) 600 MG tablet Take 1 tablet (600 mg total) by mouth every 6 (six) hours as needed. 12/23/23   Suellen Cantor A, PA-C    Allergies: Patient has no known allergies.    Review of Systems  Updated Vital Signs BP 136/87 (BP Location: Right Arm)   Pulse 81   Temp 99 F (37.2 C) (Oral)   Resp 18   Ht 6' (1.829 m)   Wt 59 kg   SpO2 95%   BMI 17.63 kg/m   Physical Exam Vitals and nursing note reviewed.  Constitutional:      General: He is not in acute distress.     Appearance: He is well-developed.  HENT:     Head: Normocephalic and atraumatic.  Eyes:     Conjunctiva/sclera: Conjunctivae normal.  Cardiovascular:     Rate and Rhythm: Normal rate and regular rhythm.     Heart sounds: No murmur heard. Pulmonary:     Effort: Pulmonary effort is normal. No respiratory distress.     Breath sounds: Normal breath sounds.  Abdominal:     Palpations: Abdomen is soft.     Tenderness: There is no abdominal tenderness.  Musculoskeletal:        General: No swelling.     Cervical back: Neck supple.     Comments: Normal strength and sensation in bilateral upper extremities.  Normal pulse close bilateral wrists  Skin:    General: Skin is warm and dry.     Capillary Refill: Capillary refill takes less than 2 seconds.  Neurological:     General: No focal deficit present.     Mental Status: He is alert and oriented to person, place, and time.  Psychiatric:        Mood and Affect: Mood normal.     (all labs ordered are listed, but only abnormal results are displayed) Labs Reviewed  BASIC METABOLIC PANEL WITH GFR - Abnormal; Notable  for the following components:      Result Value   Chloride 97 (*)    Anion gap 16 (*)    All other components within normal limits  CBC WITH DIFFERENTIAL/PLATELET - Abnormal; Notable for the following components:   RBC 4.05 (*)    Hemoglobin 12.8 (*)    HCT 37.6 (*)    All other components within normal limits  TROPONIN I (HIGH SENSITIVITY)    EKG: EKG Interpretation Date/Time:  Wednesday April 30 2024 21:31:26 EDT Ventricular Rate:  73 PR Interval:  195 QRS Duration:  79 QT Interval:  371 QTC Calculation: 409 R Axis:   75  Text Interpretation: Sinus rhythm Compared with prior EKG from 03/14/2024 Confirmed by Gennaro Bouchard ([id number]) on 04/30/2024 9:43:12 PM  Radiology: [facility] Chest 2 View Result Date: 04/30/2024 CLINICAL DATA:  chest pain Pt states 3 days of intermittent chest pain and left arm numbness EXAM: CHEST  - 2 VIEW COMPARISON:  Chest x-ray 03/14/2024 FINDINGS: The heart and mediastinal contours are within normal limits. No focal consolidation. No pulmonary edema. No pleural effusion. No pneumothorax. No acute osseous abnormality. IMPRESSION: No active cardiopulmonary disease. Electronically Signed   By: Morgane  Naveau M.D.   On: 04/30/2024 19:48    {Document cardiac monitor, telemetry assessment procedure when appropriate:32947} Procedures   Medications Ordered in the ED - No data to display    {Click here for ABCD2, HEART and other calculators REFRESH Note before signing:1}                              Medical Decision Making Amount and/or Complexity of Data Reviewed Labs: ordered. Radiology: ordered.   ***  {Document critical care time when appropriate  Document review of labs and clinical decision tools ie [id number], etc  Document your independent review of radiology images and any outside records  Document your discussion with family members, caretakers and with consultants  Document social determinants of health affecting pt's care  Document your decision making why or why not admission, treatments were needed:32947:::1}   Final diagnoses:  None    ED Discharge Orders     None

## 2024-04-30 NOTE — Discharge Instructions (Signed)
 Pleasure taking care of you today.  You are seen for chest pain and your workup was overall very reassuring.  Your cardiac enzymes on your blood work were normal, EKG was normal and chest x-ray was normal.  Follow-up with your PCP.  Regarding the left arm numbness has been going on for a few weeks it is on and off.  He has normal strength and sensation at this time.  Please follow-up with your PCP and/or neurology.

## 2024-05-26 ENCOUNTER — Encounter: Payer: Self-pay | Admitting: Neurology

## 2024-06-16 ENCOUNTER — Encounter: Payer: Self-pay | Admitting: Neurology

## 2024-08-26 ENCOUNTER — Ambulatory Visit: Admitting: Neurology

## 2024-09-01 ENCOUNTER — Ambulatory Visit: Admitting: Neurology

## 2024-11-24 ENCOUNTER — Ambulatory Visit: Admitting: Neurology
